# Patient Record
Sex: Female | Born: 1995 | Race: White | Hispanic: No | Marital: Married | Smoking: Never smoker
Health system: Southern US, Community
[De-identification: ages and names within clinical notes are randomized; demographics above are authoritative.]

## PROBLEM LIST (undated history)

## (undated) HISTORY — PX: WISDOM TOOTH EXTRACTION: SHX21

---

## 2017-01-24 ENCOUNTER — Ambulatory Visit: Payer: Self-pay | Admitting: Obstetrics & Gynecology

## 2017-01-31 DIAGNOSIS — R0602 Shortness of breath: Secondary | ICD-10-CM | POA: Diagnosis not present

## 2017-01-31 DIAGNOSIS — R002 Palpitations: Secondary | ICD-10-CM | POA: Diagnosis not present

## 2017-02-21 DIAGNOSIS — E038 Other specified hypothyroidism: Secondary | ICD-10-CM | POA: Diagnosis not present

## 2017-02-21 DIAGNOSIS — E782 Mixed hyperlipidemia: Secondary | ICD-10-CM | POA: Diagnosis not present

## 2017-02-21 DIAGNOSIS — E559 Vitamin D deficiency, unspecified: Secondary | ICD-10-CM | POA: Diagnosis not present

## 2017-02-21 DIAGNOSIS — E119 Type 2 diabetes mellitus without complications: Secondary | ICD-10-CM | POA: Diagnosis not present

## 2017-02-21 DIAGNOSIS — N925 Other specified irregular menstruation: Secondary | ICD-10-CM | POA: Diagnosis not present

## 2017-02-21 DIAGNOSIS — I10 Essential (primary) hypertension: Secondary | ICD-10-CM | POA: Diagnosis not present

## 2017-02-21 DIAGNOSIS — D518 Other vitamin B12 deficiency anemias: Secondary | ICD-10-CM | POA: Diagnosis not present

## 2017-02-22 DIAGNOSIS — R0600 Dyspnea, unspecified: Secondary | ICD-10-CM | POA: Diagnosis not present

## 2017-02-22 DIAGNOSIS — R002 Palpitations: Secondary | ICD-10-CM | POA: Diagnosis not present

## 2017-03-07 DIAGNOSIS — R0602 Shortness of breath: Secondary | ICD-10-CM | POA: Diagnosis not present

## 2017-03-07 DIAGNOSIS — R002 Palpitations: Secondary | ICD-10-CM | POA: Diagnosis not present

## 2017-03-15 DIAGNOSIS — N926 Irregular menstruation, unspecified: Secondary | ICD-10-CM | POA: Diagnosis not present

## 2017-03-15 DIAGNOSIS — M419 Scoliosis, unspecified: Secondary | ICD-10-CM | POA: Diagnosis not present

## 2017-03-15 DIAGNOSIS — N925 Other specified irregular menstruation: Secondary | ICD-10-CM | POA: Diagnosis not present

## 2017-03-15 DIAGNOSIS — M4185 Other forms of scoliosis, thoracolumbar region: Secondary | ICD-10-CM | POA: Diagnosis not present

## 2017-03-15 DIAGNOSIS — R0781 Pleurodynia: Secondary | ICD-10-CM | POA: Diagnosis not present

## 2017-03-15 DIAGNOSIS — D270 Benign neoplasm of right ovary: Secondary | ICD-10-CM | POA: Diagnosis not present

## 2017-03-15 DIAGNOSIS — M549 Dorsalgia, unspecified: Secondary | ICD-10-CM | POA: Diagnosis not present

## 2017-03-21 DIAGNOSIS — Z6820 Body mass index (BMI) 20.0-20.9, adult: Secondary | ICD-10-CM | POA: Diagnosis not present

## 2017-03-21 DIAGNOSIS — I471 Supraventricular tachycardia: Secondary | ICD-10-CM | POA: Diagnosis not present

## 2017-03-21 DIAGNOSIS — R002 Palpitations: Secondary | ICD-10-CM | POA: Diagnosis not present

## 2017-03-22 DIAGNOSIS — N898 Other specified noninflammatory disorders of vagina: Secondary | ICD-10-CM | POA: Diagnosis not present

## 2017-03-22 DIAGNOSIS — N925 Other specified irregular menstruation: Secondary | ICD-10-CM | POA: Diagnosis not present

## 2017-03-22 DIAGNOSIS — M4184 Other forms of scoliosis, thoracic region: Secondary | ICD-10-CM | POA: Diagnosis not present

## 2017-04-11 DIAGNOSIS — K219 Gastro-esophageal reflux disease without esophagitis: Secondary | ICD-10-CM | POA: Diagnosis not present

## 2017-04-11 DIAGNOSIS — R269 Unspecified abnormalities of gait and mobility: Secondary | ICD-10-CM | POA: Diagnosis not present

## 2017-04-11 DIAGNOSIS — R1013 Epigastric pain: Secondary | ICD-10-CM | POA: Diagnosis not present

## 2017-05-13 DIAGNOSIS — Z6821 Body mass index (BMI) 21.0-21.9, adult: Secondary | ICD-10-CM | POA: Diagnosis not present

## 2017-05-13 DIAGNOSIS — R319 Hematuria, unspecified: Secondary | ICD-10-CM | POA: Diagnosis not present

## 2017-05-13 DIAGNOSIS — R300 Dysuria: Secondary | ICD-10-CM | POA: Diagnosis not present

## 2017-06-25 DIAGNOSIS — R1013 Epigastric pain: Secondary | ICD-10-CM | POA: Diagnosis not present

## 2017-07-01 DIAGNOSIS — K3189 Other diseases of stomach and duodenum: Secondary | ICD-10-CM | POA: Diagnosis not present

## 2017-07-01 DIAGNOSIS — K2970 Gastritis, unspecified, without bleeding: Secondary | ICD-10-CM | POA: Diagnosis not present

## 2017-07-01 DIAGNOSIS — K219 Gastro-esophageal reflux disease without esophagitis: Secondary | ICD-10-CM | POA: Diagnosis not present

## 2017-07-01 DIAGNOSIS — K2900 Acute gastritis without bleeding: Secondary | ICD-10-CM | POA: Diagnosis not present

## 2017-07-01 DIAGNOSIS — K2950 Unspecified chronic gastritis without bleeding: Secondary | ICD-10-CM | POA: Diagnosis not present

## 2017-07-01 DIAGNOSIS — D72820 Lymphocytosis (symptomatic): Secondary | ICD-10-CM | POA: Diagnosis not present

## 2017-07-01 DIAGNOSIS — R1013 Epigastric pain: Secondary | ICD-10-CM | POA: Diagnosis not present

## 2017-07-22 DIAGNOSIS — R109 Unspecified abdominal pain: Secondary | ICD-10-CM | POA: Diagnosis not present

## 2017-07-22 DIAGNOSIS — D270 Benign neoplasm of right ovary: Secondary | ICD-10-CM | POA: Diagnosis not present

## 2017-07-22 DIAGNOSIS — N925 Other specified irregular menstruation: Secondary | ICD-10-CM | POA: Diagnosis not present

## 2017-08-01 DIAGNOSIS — N925 Other specified irregular menstruation: Secondary | ICD-10-CM | POA: Diagnosis not present

## 2017-08-01 DIAGNOSIS — M4184 Other forms of scoliosis, thoracic region: Secondary | ICD-10-CM | POA: Diagnosis not present

## 2017-08-01 DIAGNOSIS — R590 Localized enlarged lymph nodes: Secondary | ICD-10-CM | POA: Diagnosis not present

## 2017-08-10 DIAGNOSIS — N3000 Acute cystitis without hematuria: Secondary | ICD-10-CM | POA: Diagnosis not present

## 2017-08-10 DIAGNOSIS — N3090 Cystitis, unspecified without hematuria: Secondary | ICD-10-CM | POA: Diagnosis not present

## 2017-09-17 DIAGNOSIS — Z01419 Encounter for gynecological examination (general) (routine) without abnormal findings: Secondary | ICD-10-CM | POA: Diagnosis not present

## 2017-09-17 DIAGNOSIS — Z113 Encounter for screening for infections with a predominantly sexual mode of transmission: Secondary | ICD-10-CM | POA: Diagnosis not present

## 2017-09-17 DIAGNOSIS — N898 Other specified noninflammatory disorders of vagina: Secondary | ICD-10-CM | POA: Diagnosis not present

## 2017-09-17 DIAGNOSIS — Z8742 Personal history of other diseases of the female genital tract: Secondary | ICD-10-CM | POA: Diagnosis not present

## 2017-09-17 DIAGNOSIS — Z118 Encounter for screening for other infectious and parasitic diseases: Secondary | ICD-10-CM | POA: Diagnosis not present

## 2017-09-17 DIAGNOSIS — Z23 Encounter for immunization: Secondary | ICD-10-CM | POA: Diagnosis not present

## 2017-09-17 DIAGNOSIS — N390 Urinary tract infection, site not specified: Secondary | ICD-10-CM | POA: Diagnosis not present

## 2017-09-17 DIAGNOSIS — Z124 Encounter for screening for malignant neoplasm of cervix: Secondary | ICD-10-CM | POA: Diagnosis not present

## 2017-09-17 DIAGNOSIS — N9412 Deep dyspareunia: Secondary | ICD-10-CM | POA: Diagnosis not present

## 2017-09-18 DIAGNOSIS — N390 Urinary tract infection, site not specified: Secondary | ICD-10-CM | POA: Diagnosis not present

## 2017-09-18 DIAGNOSIS — R82998 Other abnormal findings in urine: Secondary | ICD-10-CM | POA: Diagnosis not present

## 2017-10-03 DIAGNOSIS — N390 Urinary tract infection, site not specified: Secondary | ICD-10-CM | POA: Diagnosis not present

## 2017-10-16 DIAGNOSIS — D270 Benign neoplasm of right ovary: Secondary | ICD-10-CM | POA: Diagnosis not present

## 2017-10-16 DIAGNOSIS — N9412 Deep dyspareunia: Secondary | ICD-10-CM | POA: Diagnosis not present

## 2017-12-05 DIAGNOSIS — R1084 Generalized abdominal pain: Secondary | ICD-10-CM | POA: Diagnosis not present

## 2018-04-10 DIAGNOSIS — R10814 Left lower quadrant abdominal tenderness: Secondary | ICD-10-CM | POA: Diagnosis not present

## 2018-04-10 DIAGNOSIS — K4190 Unilateral femoral hernia, without obstruction or gangrene, not specified as recurrent: Secondary | ICD-10-CM | POA: Diagnosis not present

## 2018-04-11 DIAGNOSIS — Z13220 Encounter for screening for lipoid disorders: Secondary | ICD-10-CM | POA: Diagnosis not present

## 2018-04-11 DIAGNOSIS — R10814 Left lower quadrant abdominal tenderness: Secondary | ICD-10-CM | POA: Diagnosis not present

## 2018-04-17 DIAGNOSIS — R109 Unspecified abdominal pain: Secondary | ICD-10-CM | POA: Diagnosis not present

## 2018-04-17 DIAGNOSIS — R1900 Intra-abdominal and pelvic swelling, mass and lump, unspecified site: Secondary | ICD-10-CM | POA: Diagnosis not present

## 2018-04-17 DIAGNOSIS — R1904 Left lower quadrant abdominal swelling, mass and lump: Secondary | ICD-10-CM | POA: Diagnosis not present

## 2018-06-21 DIAGNOSIS — N3001 Acute cystitis with hematuria: Secondary | ICD-10-CM | POA: Diagnosis not present

## 2018-06-21 DIAGNOSIS — R300 Dysuria: Secondary | ICD-10-CM | POA: Diagnosis not present

## 2018-06-21 DIAGNOSIS — R1084 Generalized abdominal pain: Secondary | ICD-10-CM | POA: Diagnosis not present

## 2019-05-18 DIAGNOSIS — N390 Urinary tract infection, site not specified: Secondary | ICD-10-CM | POA: Diagnosis not present

## 2019-12-13 DIAGNOSIS — Z20818 Contact with and (suspected) exposure to other bacterial communicable diseases: Secondary | ICD-10-CM | POA: Diagnosis not present

## 2019-12-13 DIAGNOSIS — Z20822 Contact with and (suspected) exposure to covid-19: Secondary | ICD-10-CM | POA: Diagnosis not present

## 2019-12-23 DIAGNOSIS — U071 COVID-19: Secondary | ICD-10-CM | POA: Diagnosis not present

## 2020-02-16 ENCOUNTER — Emergency Department (HOSPITAL_COMMUNITY)

## 2020-02-16 ENCOUNTER — Other Ambulatory Visit: Payer: Self-pay

## 2020-02-16 ENCOUNTER — Inpatient Hospital Stay (HOSPITAL_COMMUNITY)
Admission: EM | Admit: 2020-02-16 | Discharge: 2020-02-17 | DRG: 909 | Disposition: A | Discharge status: Home / Self Care | Attending: Student | Admitting: Student

## 2020-02-16 ENCOUNTER — Encounter (HOSPITAL_COMMUNITY): Payer: Self-pay

## 2020-02-16 DIAGNOSIS — Y9263 Factory as the place of occurrence of the external cause: Secondary | ICD-10-CM

## 2020-02-16 DIAGNOSIS — Z20822 Contact with and (suspected) exposure to covid-19: Secondary | ICD-10-CM | POA: Diagnosis present

## 2020-02-16 DIAGNOSIS — S81831A Puncture wound without foreign body, right lower leg, initial encounter: Secondary | ICD-10-CM | POA: Diagnosis present

## 2020-02-16 DIAGNOSIS — W230XXA Caught, crushed, jammed, or pinched between moving objects, initial encounter: Secondary | ICD-10-CM | POA: Diagnosis present

## 2020-02-16 DIAGNOSIS — S8781XA Crushing injury of right lower leg, initial encounter: Principal | ICD-10-CM | POA: Diagnosis present

## 2020-02-16 DIAGNOSIS — Y990 Civilian activity done for income or pay: Secondary | ICD-10-CM | POA: Diagnosis not present

## 2020-02-16 LAB — CBC WITH DIFFERENTIAL/PLATELET
Abs Immature Granulocytes: 0.08 10*3/uL — ABNORMAL HIGH (ref 0.00–0.07)
Basophils Absolute: 0.1 10*3/uL (ref 0.0–0.1)
Basophils Relative: 0 %
Eosinophils Absolute: 0.1 10*3/uL (ref 0.0–0.5)
Eosinophils Relative: 0 %
HCT: 40.5 % (ref 36.0–46.0)
Hemoglobin: 13.6 g/dL (ref 12.0–15.0)
Immature Granulocytes: 1 %
Lymphocytes Relative: 14 %
Lymphs Abs: 2.4 10*3/uL (ref 0.7–4.0)
MCH: 30.7 pg (ref 26.0–34.0)
MCHC: 33.6 g/dL (ref 30.0–36.0)
MCV: 91.4 fL (ref 80.0–100.0)
Monocytes Absolute: 1.2 10*3/uL — ABNORMAL HIGH (ref 0.1–1.0)
Monocytes Relative: 7 %
Neutro Abs: 13.3 10*3/uL — ABNORMAL HIGH (ref 1.7–7.7)
Neutrophils Relative %: 78 %
Platelets: 278 10*3/uL (ref 150–400)
RBC: 4.43 MIL/uL (ref 3.87–5.11)
RDW: 12.5 % (ref 11.5–15.5)
WBC: 17.1 10*3/uL — ABNORMAL HIGH (ref 4.0–10.5)
nRBC: 0 % (ref 0.0–0.2)

## 2020-02-16 LAB — VITAMIN D 25 HYDROXY (VIT D DEFICIENCY, FRACTURES): Vit D, 25-Hydroxy: 29.12 ng/mL — ABNORMAL LOW (ref 30–100)

## 2020-02-16 LAB — POC URINE PREG, ED: Preg Test, Ur: NEGATIVE

## 2020-02-16 LAB — BASIC METABOLIC PANEL WITH GFR
Anion gap: 9 (ref 5–15)
BUN: 12 mg/dL (ref 6–20)
CO2: 23 mmol/L (ref 22–32)
Calcium: 9 mg/dL (ref 8.9–10.3)
Chloride: 105 mmol/L (ref 98–111)
Creatinine, Ser: 0.98 mg/dL (ref 0.44–1.00)
GFR calc Af Amer: >60 mL/min (ref >60)
GFR calc non Af Amer: >60 mL/min (ref >60)
Glucose, Bld: 141 mg/dL — ABNORMAL HIGH (ref 70–99)
Potassium: 3.4 mmol/L — ABNORMAL LOW (ref 3.5–5.1)
Sodium: 137 mmol/L (ref 135–145)

## 2020-02-16 LAB — RESPIRATORY PANEL BY RT PCR (FLU A&B, COVID)
Influenza A by PCR: NEGATIVE
Influenza B by PCR: NEGATIVE
SARS Coronavirus 2 by RT PCR: NEGATIVE

## 2020-02-16 LAB — CK: Total CK: 2228 U/L — ABNORMAL HIGH (ref 38–234)

## 2020-02-16 LAB — HIV ANTIBODY (ROUTINE TESTING W REFLEX): HIV Screen 4th Generation wRfx: NONREACTIVE

## 2020-02-16 MED ORDER — PREGABALIN 75 MG PO CAPS
75.0000 mg | ORAL_CAPSULE | Freq: Two times a day (BID) | ORAL | Status: DC
  Administered 2020-02-16: 75 mg via ORAL
  Filled 2020-02-16: qty 1

## 2020-02-16 MED ORDER — DOCUSATE SODIUM 100 MG PO CAPS
100.0000 mg | ORAL_CAPSULE | Freq: Two times a day (BID) | ORAL | Status: DC
  Administered 2020-02-16: 100 mg via ORAL
  Filled 2020-02-16: qty 1

## 2020-02-16 MED ORDER — ACETAMINOPHEN 500 MG PO TABS
500.0000 mg | ORAL_TABLET | Freq: Three times a day (TID) | ORAL | Status: DC
  Administered 2020-02-17: 500 mg via ORAL
  Filled 2020-02-16 (×2): qty 1

## 2020-02-16 MED ORDER — MORPHINE SULFATE (PF) 2 MG/ML IV SOLN
0.5000 mg | INTRAVENOUS | Status: DC | PRN
  Administered 2020-02-17: 1 mg via INTRAVENOUS
  Filled 2020-02-16: qty 1

## 2020-02-16 MED ORDER — HYDROMORPHONE HCL 1 MG/ML IJ SOLN
1.0000 mg | Freq: Once | INTRAMUSCULAR | Status: AC
  Administered 2020-02-16: 1 mg via INTRAVENOUS
  Filled 2020-02-16: qty 1

## 2020-02-16 MED ORDER — ONDANSETRON HCL 4 MG/2ML IJ SOLN
4.0000 mg | Freq: Four times a day (QID) | INTRAMUSCULAR | Status: DC | PRN
  Administered 2020-02-16: 4 mg via INTRAVENOUS
  Filled 2020-02-16: qty 2

## 2020-02-16 MED ORDER — LACTATED RINGERS IV SOLN: INTRAVENOUS | Status: DC

## 2020-02-16 MED ORDER — METHOCARBAMOL 500 MG PO TABS
750.0000 mg | ORAL_TABLET | Freq: Three times a day (TID) | ORAL | Status: DC
  Administered 2020-02-16 – 2020-02-17 (×2): 750 mg via ORAL
  Filled 2020-02-16 (×2): qty 2

## 2020-02-16 MED ORDER — HYDROCODONE-ACETAMINOPHEN 7.5-325 MG PO TABS
1.0000 | ORAL_TABLET | ORAL | Status: DC | PRN
  Administered 2020-02-17: 2 via ORAL
  Filled 2020-02-16: qty 2

## 2020-02-16 MED ORDER — SODIUM CHLORIDE 0.9 % IV SOLN
2.0000 g | INTRAVENOUS | Status: DC
  Administered 2020-02-16: 2 g via INTRAVENOUS
  Filled 2020-02-16: qty 20

## 2020-02-16 MED ORDER — MAGNESIUM HYDROXIDE 400 MG/5ML PO SUSP
30.0000 mL | Freq: Every day | ORAL | Status: DC | PRN
  Filled 2020-02-16: qty 30

## 2020-02-16 MED ORDER — ASCORBIC ACID 500 MG PO TABS
1000.0000 mg | ORAL_TABLET | Freq: Every day | ORAL | Status: DC
  Administered 2020-02-16: 1000 mg via ORAL
  Filled 2020-02-16: qty 2

## 2020-02-16 MED ORDER — ACETAMINOPHEN 325 MG PO TABS: 325.0000 mg | ORAL_TABLET | Freq: Four times a day (QID) | ORAL | Status: DC | PRN

## 2020-02-16 MED ORDER — SODIUM CHLORIDE 0.9 % IV SOLN: INTRAVENOUS | Status: DC

## 2020-02-16 MED ORDER — CEFAZOLIN SODIUM-DEXTROSE 2-4 GM/100ML-% IV SOLN
2.0000 g | Freq: Once | INTRAVENOUS | Status: AC
  Administered 2020-02-16: 2 g via INTRAVENOUS
  Filled 2020-02-16: qty 100

## 2020-02-16 MED ORDER — METHOCARBAMOL 1000 MG/10ML IJ SOLN
500.0000 mg | Freq: Three times a day (TID) | INTRAVENOUS | Status: DC
  Filled 2020-02-16 (×4): qty 5

## 2020-02-16 MED ORDER — TETANUS-DIPHTH-ACELL PERTUSSIS 5-2.5-18.5 LF-MCG/0.5 IM SUSP
0.5000 mL | Freq: Once | INTRAMUSCULAR | Status: AC
  Administered 2020-02-16: 0.5 mL via INTRAMUSCULAR
  Filled 2020-02-16: qty 0.5

## 2020-02-16 MED ORDER — HYDROCODONE-ACETAMINOPHEN 5-325 MG PO TABS
1.0000 | ORAL_TABLET | ORAL | Status: DC | PRN
  Administered 2020-02-16 – 2020-02-17 (×2): 2 via ORAL
  Filled 2020-02-16 (×2): qty 2

## 2020-02-16 NOTE — ED Notes (Signed)
Labs drawn, labeled with 2 pt identifiers, and sent to lab Covid swab collected, labeled with 2 pt identifiers, and sent to lab

## 2020-02-16 NOTE — ED Notes (Signed)
Report given to Ashley, RN. All questions answered

## 2020-02-16 NOTE — ED Triage Notes (Signed)
Pt from work at [employer] via ems; while at work pt standing on machine, machine crushed leg betweenm 2 parts of machine  that came together, crushed leg; deformity to R leg; dime sized  wound medial R upper calf; oozing blood; pedal pulses present, cap refill less than 2 seconds, sinus tach 120s, 75 mcg fentanyl given PTA, pain 8/10-5/10 after fentanyl; CBG 106; 136/78, 100% RA

## 2020-02-16 NOTE — ED Notes (Signed)
Meds given per MAR. Name/DOB verified with pt. Pt transported to 5N02 in NAD with all belongings via cart transport. Pt alert, speaking in full sentences. Breathing easy, non-labored. Equal rise and fall of chest noted

## 2020-02-16 NOTE — H&P (Signed)
[facility] H&P    Renee Taylor is an 24 y.o. female.  HPI: Patient works in [employer], educational and sustained a crushing injury to her right calf today at 3:30 pm. Last ate at 2:00 pm. Pain is moderately severe, dull, and aching without tingling or numbness currently. Was slightly worse before. Elevated with saline dressing at heart level but no ice. Able to tolerate flexion and extension of great and lesser toes without difficulty. Two wounds with significant loss of muscle contour on the medial side underlying 2 cm laceration and a small poke hole posteriorly which is less than 5 mm but again ecchymosis and significant soft tissue injury. Significant other at bedside.  History reviewed. No pertinent past medical history.        Past Surgical History:  Procedure Laterality Date  . WISDOM TOOTH EXTRACTION     No family history on file.  Social History: reports that she has never smoked. She has never used smokeless tobacco. She reports current alcohol use. She reports that she does not use drugs.  Allergies:      Allergies  Allergen Reactions  . Meloxicam Shortness Of Breath  . Sulfa Antibiotics Nausea And Vomiting  . Macrobid [Nitrofurantoin] Rash   Medications: Prior to Admission: (Not in a hospital admission)   Lab Results Last 48 Hours                                                                                                                                                                                                                                                             Imaging Results (Last 48 hours)    ROS No recent fever, bleeding abnormalities, urologic dysfunction, GI problems, or weight gain.   Blood pressure 111/79, pulse (!) 122, temperature 98.1 F (36.7 C), temperature source Oral, resp. rate (!) 25, height 5\' 2"  (1.575 m), weight 58.1 kg, last menstrual period 02/04/2020, SpO2 100 %.   Physical Exam   Mild distress, but tears have dried.  Reg rhythm, Tachycardic 120's, 130's on first eval  No audible wheezing  LLE Tender, open wounds 2 cm laceration anteromedial and a small poke hole posteriorly which is less than 5 mm with significant loss of muscle contour on the medial side underlying wound; scattered ecchymosis  No knee or  ankle effusion  Knee stability could not be assessed well given pain in calf  Sens DPN, SPN, TN intact  Motor EHL, ext, flex, evers intact grossly  DP 2+, PT 2+, No significant distal edema   Assessment/Plan:  Right leg crush injury with obviously muscle and soft tissue injury, potential for rhabdomyolysis  1. Admit for observation, CK checks, and aggressive hydration  2. Plan for surgical debridement tonight or tomorrow am depending on Covid test which unfortunately has just been ordered  3. IV abx with Rocephin given contaminated industrial equipment  4. Fresh saline dressing has been applied.  5. Aggressive ice  6. Discharge post op could occur depending on intraop findings, labs, and PT   Myrene Galas, MD  [facility], [facility]  [phone]  02/16/2020 8:31 PM

## 2020-02-16 NOTE — ED Provider Notes (Signed)
The patient is a 24 year old female presenting with a crush injury to her right lower extremity, she states that her leg got caught between 2 pieces of machinery that crushed, she did suffered a puncture wound to the anterior medial aspect of the proximal lower extremity just distal to the knee, she has an obvious deformity in that area, she has good pulses at the foot.  She has no other signs of injury but is mildly tachycardic.  She does have an open wound, tetanus will be updated, will treat for possible open fracture with 2 g of Ancef  Pain control  Orthopedic consultation as imaging suggest.  No signs of compartment syndrome at this time.  Medical screening examination/treatment/procedure(s) were conducted as a shared visit with non-physician practitioner(s) and myself.  I personally evaluated the patient during the encounter.  Clinical Impression:   Final diagnoses:  Crushing injury of right lower leg, initial encounter         Eber Hong, MD 02/18/20 2250

## 2020-02-16 NOTE — ED Notes (Signed)
Pt transported to xray 

## 2020-02-16 NOTE — ED Notes (Incomplete)
Meds given per MAR. Name/DOB verified with pt 

## 2020-02-16 NOTE — Consult Note (Addendum)
Orthopaedic Trauma Service Consultation  Reason for Consult: right calf crush injury Requesting Physician: Eber Hong, MD  Renee Taylor is an 24 y.o. female.  HPI: Patient works in Set [employer] and sustained a crushing injury to her right calf today at 3:30 pm. Last ate at 2:00 pm. Pain is moderately severe, dull, and aching without tingling or numbness currently. Was slightly worse before. Elevated with saline dressing at heart level but no ice. Able to tolerate flexion and extension of great and lesser toes without difficulty. Two wounds with significant loss of muscle contour on the medial side underlying 2 cm laceration and a small poke hole posteriorly which is less than 5 mm but again ecchymosis and significant soft tissue injury. Significant other at bedside.   History reviewed. No pertinent past medical history.  Past Surgical History:  Procedure Laterality Date  . WISDOM TOOTH EXTRACTION      No family history on file.  Social History:  reports that she has never smoked. She has never used smokeless tobacco. She reports current alcohol use. She reports that she does not use drugs.  Allergies:  Allergies  Allergen Reactions  . Meloxicam Shortness Of Breath  . Sulfa Antibiotics Nausea And Vomiting  . Macrobid [Nitrofurantoin] Rash    Medications: Prior to Admission: (Not in a hospital admission)   Results for orders placed or performed during the hospital encounter of 02/16/20 (from the past 48 hour(s))  CBC with Differential/Platelet     Status: Abnormal   Collection Time: 02/16/20  5:00 PM  Result Value Ref Range   WBC 17.1 (H) 4.0 - 10.5 K/uL   RBC 4.43 3.87 - 5.11 MIL/uL   Hemoglobin 13.6 12.0 - 15.0 g/dL   HCT 44.0 10.2 - 72.5 %   MCV 91.4 80.0 - 100.0 fL   MCH 30.7 26.0 - 34.0 pg   MCHC 33.6 30.0 - 36.0 g/dL   RDW 36.6 44.0 - 34.7 %   Platelets 278 150 - 400 K/uL   nRBC 0.0 0.0 - 0.2 %   Neutrophils Relative % 78 %   Neutro Abs 13.3 (H) 1.7 - 7.7 K/uL    Lymphocytes Relative 14 %   Lymphs Abs 2.4 0.7 - 4.0 K/uL   Monocytes Relative 7 %   Monocytes Absolute 1.2 (H) 0.1 - 1.0 K/uL   Eosinophils Relative 0 %   Eosinophils Absolute 0.1 0.0 - 0.5 K/uL   Basophils Relative 0 %   Basophils Absolute 0.1 0.0 - 0.1 K/uL   Immature Granulocytes 1 %   Abs Immature Granulocytes 0.08 (H) 0.00 - 0.07 K/uL    Comment: Performed at [facility], [address], [place], [place] [address]  Basic metabolic panel     Status: Abnormal   Collection Time: 02/16/20  5:00 PM  Result Value Ref Range   Sodium 137 135 - 145 mmol/L   Potassium 3.4 (L) 3.5 - 5.1 mmol/L   Chloride 105 98 - 111 mmol/L   CO2 23 22 - 32 mmol/L   Glucose, Bld 141 (H) 70 - 99 mg/dL    Comment: Glucose reference range applies only to samples taken after fasting for at least 8 hours.   BUN 12 6 - 20 mg/dL   Creatinine, Ser 6.38 0.44 - 1.00 mg/dL   Calcium 9.0 8.9 - 75.6 mg/dL   GFR calc non Af Amer >60 >60 mL/min   GFR calc Af Amer >60 >60 mL/min   Anion gap 9 5 - 15  Comment: Performed at [facility], [place] [address] [place], [place] [address]    DG Tibia/Fibula Right  Result Date: 02/16/2020 CLINICAL DATA:  Trauma, compression injury EXAM: RIGHT TIBIA AND FIBULA - 2 VIEW COMPARISON:  None. FINDINGS: No fracture of the tibia or fibula. There is soft tissue injury in the medial LEFT calf some subcutaneous gas and soft tissue depression.a. IMPRESSION: 1. Soft tissue injury. 2. No evidence of fracture. Electronically Signed   By: Suzy Bouchard M.D.   On: 02/16/2020 18:04   DG Knee Complete 4 Views Right  Result Date: 02/16/2020 CLINICAL DATA:  Status post trauma. EXAM: RIGHT KNEE - COMPLETE 4+ VIEW COMPARISON:  None. FINDINGS: No evidence of fracture, dislocation, or joint effusion. No evidence of arthropathy or other focal bone abnormality. A mild amount of soft tissue air is seen along the medial and lateral aspects of the right calf. IMPRESSION: Mild amount of soft  tissue air within the medial and lateral aspects of the right calf, without evidence of associated acute osseous abnormality. Electronically Signed   By: Virgina Norfolk M.D.   On: 02/16/2020 18:03    ROS No recent fever, bleeding abnormalities, urologic dysfunction, GI problems, or weight gain.  Blood pressure 111/79, pulse (!) 122, temperature 98.1 F (36.7 C), temperature source Oral, resp. rate (!) 25, height 5\' 2"  (1.575 m), weight 58.1 kg, last menstrual period 02/04/2020, SpO2 100 %. Physical Exam Mild distress, but tears have dried. Reg rhythm, Tachycardic 120's, 130's on first eval No audible wheezing LLE Tender, open wounds 2 cm laceration anteromedial and a small poke hole posteriorly which is less than 5 mm with significant loss of muscle contour on the medial side underlying wound; scattered ecchymosis  No knee or ankle effusion  Knee stability could not be assessed well given pain in calf  Sens DPN, SPN, TN intact  Motor EHL, ext, flex, evers intact grossly  DP 2+, PT 2+, No significant distal edema  Assessment/Plan: Right leg crush injury with obviously muscle and soft tissue injury, potential for rhabdomyolysis   1. Admit for observation, CK checks, and aggressive hydration 2. Plan for surgical debridement tonight or tomorrow am depending on Covid test which unfortunately has just been ordered 3. IV abx with Rocephin given contaminated industrial equipment 4. Fresh saline dressing has been applied. 5. Aggressive ice  6. Discharge post op could occur depending on intraop findings, labs, and PT  [place], MD [facility], [facility] [phone]  02/16/2020  8:31 PM

## 2020-02-16 NOTE — ED Notes (Signed)
Ice bags placed on pt Right leg.

## 2020-02-16 NOTE — ED Notes (Signed)
Pt requests no calls be transferred into pt room. Secretary notified and states she will make pt private.

## 2020-02-17 ENCOUNTER — Inpatient Hospital Stay (HOSPITAL_COMMUNITY)

## 2020-02-17 ENCOUNTER — Encounter (HOSPITAL_COMMUNITY)
Admission: EM | Disposition: A | Discharge status: Home / Self Care | Payer: Self-pay | Source: Home / Self Care | Attending: Orthopedic Surgery

## 2020-02-17 HISTORY — PX: I & D EXTREMITY: SHX5045

## 2020-02-17 LAB — COMPREHENSIVE METABOLIC PANEL WITH GFR
ALT: 23 U/L (ref 0–44)
AST: 68 U/L — ABNORMAL HIGH (ref 15–41)
Albumin: 3.7 g/dL (ref 3.5–5.0)
Alkaline Phosphatase: 48 U/L (ref 38–126)
Anion gap: 12 (ref 5–15)
BUN: 8 mg/dL (ref 6–20)
CO2: 24 mmol/L (ref 22–32)
Calcium: 9 mg/dL (ref 8.9–10.3)
Chloride: 105 mmol/L (ref 98–111)
Creatinine, Ser: 0.93 mg/dL (ref 0.44–1.00)
GFR calc Af Amer: >60 mL/min (ref >60)
GFR calc non Af Amer: >60 mL/min (ref >60)
Glucose, Bld: 112 mg/dL — ABNORMAL HIGH (ref 70–99)
Potassium: 3.9 mmol/L (ref 3.5–5.1)
Sodium: 141 mmol/L (ref 135–145)
Total Bilirubin: 0.7 mg/dL (ref 0.3–1.2)
Total Protein: 6.5 g/dL (ref 6.5–8.1)

## 2020-02-17 LAB — CBC
HCT: 38.6 % (ref 36.0–46.0)
Hemoglobin: 12.8 g/dL (ref 12.0–15.0)
MCH: 30.5 pg (ref 26.0–34.0)
MCHC: 33.2 g/dL (ref 30.0–36.0)
MCV: 92.1 fL (ref 80.0–100.0)
Platelets: 267 10*3/uL (ref 150–400)
RBC: 4.19 MIL/uL (ref 3.87–5.11)
RDW: 12.6 % (ref 11.5–15.5)
WBC: 10.4 10*3/uL (ref 4.0–10.5)
nRBC: 0 % (ref 0.0–0.2)

## 2020-02-17 LAB — SURGICAL PCR SCREEN
MRSA, PCR: POSITIVE — AB
Staphylococcus aureus: POSITIVE — AB

## 2020-02-17 LAB — CK: Total CK: 3186 U/L — ABNORMAL HIGH (ref 38–234)

## 2020-02-17 SURGERY — IRRIGATION AND DEBRIDEMENT EXTREMITY
Anesthesia: General | Site: Leg Lower | Laterality: Right

## 2020-02-17 MED ORDER — PROMETHAZINE HCL 25 MG/ML IJ SOLN: 6.2500 mg | INTRAMUSCULAR | Status: DC | PRN

## 2020-02-17 MED ORDER — FENTANYL CITRATE (PF) 250 MCG/5ML IJ SOLN
INTRAMUSCULAR | Status: AC
  Filled 2020-02-17: qty 5

## 2020-02-17 MED ORDER — DEXAMETHASONE SODIUM PHOSPHATE 10 MG/ML IJ SOLN
INTRAMUSCULAR | Status: AC
  Filled 2020-02-17: qty 1

## 2020-02-17 MED ORDER — VANCOMYCIN HCL 1000 MG IV SOLR
INTRAVENOUS | Status: AC
  Filled 2020-02-17: qty 1000

## 2020-02-17 MED ORDER — FENTANYL CITRATE (PF) 100 MCG/2ML IJ SOLN
INTRAMUSCULAR | Status: DC | PRN
  Administered 2020-02-17: 50 ug via INTRAVENOUS

## 2020-02-17 MED ORDER — OXYCODONE HCL 5 MG PO TABS: 5.0000 mg | ORAL_TABLET | Freq: Once | ORAL | Status: DC | PRN

## 2020-02-17 MED ORDER — ONDANSETRON HCL 4 MG/2ML IJ SOLN
INTRAMUSCULAR | Status: AC
  Filled 2020-02-17: qty 2

## 2020-02-17 MED ORDER — MIDAZOLAM HCL 2 MG/2ML IJ SOLN
INTRAMUSCULAR | Status: AC
  Filled 2020-02-17: qty 2

## 2020-02-17 MED ORDER — HYDROCODONE-ACETAMINOPHEN 5-325 MG PO TABS: 1.0000 | ORAL_TABLET | Freq: Four times a day (QID) | ORAL | 0 refills | Status: AC | PRN

## 2020-02-17 MED ORDER — ACETAMINOPHEN 10 MG/ML IV SOLN: 1000.0000 mg | Freq: Once | INTRAVENOUS | Status: DC | PRN

## 2020-02-17 MED ORDER — PROPOFOL 10 MG/ML IV BOLUS
INTRAVENOUS | Status: AC
  Filled 2020-02-17: qty 40

## 2020-02-17 MED ORDER — ONDANSETRON HCL 4 MG/2ML IJ SOLN
INTRAMUSCULAR | Status: DC | PRN
  Administered 2020-02-17: 4 mg via INTRAVENOUS

## 2020-02-17 MED ORDER — MIDAZOLAM HCL 5 MG/5ML IJ SOLN
INTRAMUSCULAR | Status: DC | PRN
  Administered 2020-02-17: 2 mg via INTRAVENOUS

## 2020-02-17 MED ORDER — 0.9 % SODIUM CHLORIDE (POUR BTL) OPTIME
TOPICAL | Status: DC | PRN
  Administered 2020-02-17: 1000 mL

## 2020-02-17 MED ORDER — OXYCODONE HCL 5 MG/5ML PO SOLN: 5.0000 mg | Freq: Once | ORAL | Status: DC | PRN

## 2020-02-17 MED ORDER — SODIUM CHLORIDE 0.9 % IR SOLN
Status: DC | PRN
  Administered 2020-02-17: 3000 mL

## 2020-02-17 MED ORDER — VANCOMYCIN HCL 1000 MG IV SOLR
INTRAVENOUS | Status: DC | PRN
  Administered 2020-02-17: 1000 mg

## 2020-02-17 MED ORDER — PROPOFOL 10 MG/ML IV BOLUS
INTRAVENOUS | Status: DC | PRN
  Administered 2020-02-17: 200 mg via INTRAVENOUS

## 2020-02-17 MED ORDER — LACTATED RINGERS IV SOLN: INTRAVENOUS | Status: DC | PRN

## 2020-02-17 MED ORDER — DEXAMETHASONE SODIUM PHOSPHATE 10 MG/ML IJ SOLN
INTRAMUSCULAR | Status: DC | PRN
  Administered 2020-02-17: 5 mg via INTRAVENOUS

## 2020-02-17 MED ORDER — ASPIRIN EC 325 MG PO TBEC: 325.0000 mg | DELAYED_RELEASE_TABLET | Freq: Every day | ORAL | 0 refills | Status: AC

## 2020-02-17 MED ORDER — CEFAZOLIN SODIUM-DEXTROSE 1-4 GM/50ML-% IV SOLN
INTRAVENOUS | Status: DC | PRN
  Administered 2020-02-17: 1 g via INTRAVENOUS

## 2020-02-17 MED ORDER — VANCOMYCIN HCL 750 MG/150ML IV SOLN
750.0000 mg | Freq: Two times a day (BID) | INTRAVENOUS | Status: DC
  Administered 2020-02-17: 750 mg via INTRAVENOUS
  Filled 2020-02-17 (×3): qty 150

## 2020-02-17 MED ORDER — HYDROMORPHONE HCL 1 MG/ML IJ SOLN: 0.2500 mg | INTRAMUSCULAR | Status: DC | PRN

## 2020-02-17 MED ORDER — LIDOCAINE 2% (20 MG/ML) 5 ML SYRINGE
INTRAMUSCULAR | Status: DC | PRN
  Administered 2020-02-17: 60 mg via INTRAVENOUS

## 2020-02-17 SURGICAL SUPPLY — 50 items
BNDG COHESIVE 4X5 TAN STRL (GAUZE/BANDAGES/DRESSINGS) ×2 IMPLANT
BNDG ELASTIC 4X5.8 VLCR STR LF (GAUZE/BANDAGES/DRESSINGS) ×1 IMPLANT
BNDG GAUZE ELAST 4 BULKY (GAUZE/BANDAGES/DRESSINGS) ×4 IMPLANT
BRUSH SCRUB EZ PLAIN DRY (MISCELLANEOUS) ×4 IMPLANT
CHLORAPREP W/TINT 26 (MISCELLANEOUS) ×2 IMPLANT
COVER MAYO STAND STRL (DRAPES) ×2 IMPLANT
COVER SURGICAL LIGHT HANDLE (MISCELLANEOUS) ×4 IMPLANT
COVER WAND RF STERILE (DRAPES) ×2 IMPLANT
DRAPE ORTHO SPLIT 77X108 STRL (DRAPES) ×1
DRAPE SURG 17X23 STRL (DRAPES) ×2 IMPLANT
DRAPE SURG ORHT 6 SPLT 77X108 (DRAPES) ×1 IMPLANT
DRAPE U-SHAPE 47X51 STRL (DRAPES) ×2 IMPLANT
DRSG ADAPTIC 3X8 NADH LF (GAUZE/BANDAGES/DRESSINGS) ×2 IMPLANT
ELECT REM PT RETURN 9FT ADLT (ELECTROSURGICAL)
ELECTRODE REM PT RTRN 9FT ADLT (ELECTROSURGICAL) IMPLANT
EVACUATOR 1/8 PVC DRAIN (DRAIN) IMPLANT
GAUZE SPONGE 4X4 12PLY STRL (GAUZE/BANDAGES/DRESSINGS) ×2 IMPLANT
GLOVE BIO SURGEON STRL SZ 6.5 (GLOVE) ×6 IMPLANT
GLOVE BIO SURGEON STRL SZ7.5 (GLOVE) ×8 IMPLANT
GLOVE BIOGEL PI IND STRL 6.5 (GLOVE) ×1 IMPLANT
GLOVE BIOGEL PI IND STRL 7.5 (GLOVE) ×1 IMPLANT
GLOVE BIOGEL PI INDICATOR 6.5 (GLOVE) ×1
GLOVE BIOGEL PI INDICATOR 7.5 (GLOVE) ×1
GOWN STRL REUS W/ TWL LRG LVL3 (GOWN DISPOSABLE) ×2 IMPLANT
GOWN STRL REUS W/TWL LRG LVL3 (GOWN DISPOSABLE) ×2
HANDPIECE INTERPULSE COAX TIP (DISPOSABLE)
KIT BASIN OR (CUSTOM PROCEDURE TRAY) ×2 IMPLANT
KIT DRSG PREVENA PLUS 7DAY 125 (MISCELLANEOUS) ×1 IMPLANT
KIT PREVENA INCISION MGT 13 (CANNISTER) ×1 IMPLANT
KIT TURNOVER KIT B (KITS) ×2 IMPLANT
MANIFOLD NEPTUNE II (INSTRUMENTS) ×2 IMPLANT
NS IRRIG 1000ML POUR BTL (IV SOLUTION) ×2 IMPLANT
PACK ORTHO EXTREMITY (CUSTOM PROCEDURE TRAY) ×2 IMPLANT
PAD ARMBOARD 7.5X6 YLW CONV (MISCELLANEOUS) ×4 IMPLANT
PAD CAST 4YDX4 CTTN HI CHSV (CAST SUPPLIES) IMPLANT
PADDING CAST COTTON 4X4 STRL (CAST SUPPLIES) ×1
PADDING CAST COTTON 6X4 STRL (CAST SUPPLIES) ×2 IMPLANT
SET HNDPC FAN SPRY TIP SCT (DISPOSABLE) IMPLANT
SPONGE LAP 18X18 RF (DISPOSABLE) ×2 IMPLANT
SUT ETHILON 2 0 FS 18 (SUTURE) ×4 IMPLANT
SUT ETHILON 3 0 PS 1 (SUTURE) ×5 IMPLANT
SUT MON AB 2-0 CT1 36 (SUTURE) ×2 IMPLANT
SUT PDS AB 0 CT 36 (SUTURE) IMPLANT
SWAB CULTURE ESWAB REG 1ML (MISCELLANEOUS) IMPLANT
TOWEL GREEN STERILE (TOWEL DISPOSABLE) ×4 IMPLANT
TOWEL GREEN STERILE FF (TOWEL DISPOSABLE) ×2 IMPLANT
TUBE CONNECTING 12X1/4 (SUCTIONS) ×2 IMPLANT
UNDERPAD 30X30 (UNDERPADS AND DIAPERS) ×2 IMPLANT
WATER STERILE IRR 1000ML POUR (IV SOLUTION) ×2 IMPLANT
YANKAUER SUCT BULB TIP NO VENT (SUCTIONS) ×2 IMPLANT

## 2020-02-17 NOTE — Anesthesia Preprocedure Evaluation (Signed)
Anesthesia Evaluation    Reviewed: Allergy & Precautions, Patient's Chart, lab work & pertinent test results  Airway Mallampati: II  TM Distance: >3 FB Neck ROM: Full    Dental no notable dental hx.    Pulmonary neg pulmonary ROS,    Pulmonary exam normal breath sounds clear to auscultation       Cardiovascular negative cardio ROS Normal cardiovascular exam Rhythm:Regular Rate:Normal     Neuro/Psych negative neurological ROS     GI/Hepatic negative GI ROS, Neg liver ROS,   Endo/Other  negative endocrine ROS  Renal/GU negative Renal ROS     Musculoskeletal negative musculoskeletal ROS (+)   Abdominal   Peds  Hematology negative hematology ROS (+)   Anesthesia Other Findings right calf crush injury  Reproductive/Obstetrics hcg negative                             Anesthesia Physical Anesthesia Plan  ASA: I  Anesthesia Plan: General   Post-op Pain Management:    Induction: Intravenous  PONV Risk Score and Plan: 3 and Ondansetron, Dexamethasone, Midazolam and Treatment may vary due to age or medical condition  Airway Management Planned: LMA  Additional Equipment:   Intra-op Plan:   Post-operative Plan: Extubation in OR  Informed Consent: I have reviewed the patients History and Physical, chart, labs and discussed the procedure including the risks, benefits and alternatives for the proposed anesthesia with the patient or authorized representative who has indicated his/her understanding and acceptance.     Dental advisory given  Plan Discussed with: CRNA  Anesthesia Plan Comments:         Anesthesia Quick Evaluation

## 2020-02-17 NOTE — Transfer of Care (Signed)
Immediate Anesthesia Transfer of Care Note  Patient: Renee Taylor  Procedure(s) Performed: IRRIGATION AND DEBRIDEMENT EXTREMITY Application of Wound Vac (Right Leg Lower)  Patient Location: PACU  Anesthesia Type:General  Level of Consciousness: drowsy  Airway & Oxygen Therapy: Patient Spontanous Breathing and Patient connected to face mask oxygen  Post-op Assessment: Report given to RN and Post -op Vital signs reviewed and stable  Post vital signs: Reviewed and stable  Last Vitals:  Vitals Value Taken Time  BP 102/61 02/17/20 0925  Temp    Pulse 69 02/17/20 0926  Resp 9 02/17/20 0926  SpO2 100 % 02/17/20 0926  Vitals shown include unvalidated device data.  Last Pain:  Vitals:   02/17/20 3403  TempSrc: Oral  PainSc:          Complications: No apparent anesthesia complications

## 2020-02-17 NOTE — Anesthesia Procedure Notes (Addendum)
Procedure Name: LMA Insertion Date/Time: 02/17/2020 8:24 AM Performed by: Macie Burows, CRNA Pre-anesthesia Checklist: Patient identified, Emergency Drugs available, Suction available and Patient being monitored Patient Re-evaluated:Patient Re-evaluated prior to induction Oxygen Delivery Method: Circle system utilized Preoxygenation: Pre-oxygenation with 100% oxygen Induction Type: IV induction Ventilation: Mask ventilation without difficulty LMA: LMA inserted LMA Size: 4.0 Number of attempts: 1 Placement Confirmation: positive ETCO2 and breath sounds checked- equal and bilateral Tube secured with: Tape Dental Injury: Teeth and Oropharynx as per pre-operative assessment

## 2020-02-17 NOTE — Op Note (Signed)
Orthopaedic Surgery Operative Note (CSN: [id number] ) Date of Surgery: 02/17/2020  Admit Date: 02/16/2020   Diagnoses: Pre-Op Diagnoses: Right lower leg puncture wound/crush injury   Post-Op Diagnosis: Same  Procedures: 1. CPT 27601-Decompression of deep posterior compartment of right leg 2. CPT 11043-Debridement of right lower leg <20 sq cm 3. CPT 97605-Incisional wound vac placement  Surgeons : Primary: Beza Steppe, Gillie Manners, MD  Assistant: Ulyses Southward, PA-C  Location: OR 7   Anesthesia:General  Antibiotics: Ancef 1g preop, 1 gm vancomycin powder   Tourniquet time:None  Estimated Blood Loss:25 mL  Complications:None   Specimens:None   Implants: * No implants in log *   Indications for Surgery: 24 year old female who sustained a crushing injury to her right calf yesterday afternoon.  She received antibiotics in the emergency room after she presented.  She was initially seen by my partner Dr. Carola Frost who recommended irrigation and debridement of the open wound.  I discussed the risks and benefits of surgical intervention with the patient.  This included but not limited to bleeding, infection, nerve and blood vessel injury, DVT, need for soft tissue coverage, even the possibility anesthetic complications.  She agreed proceed with surgery and consent was obtained.  Operative Findings: 1.  2 cm laceration over the medial aspect of the calf that extended into the deep posterior compartment.  There was a smaller posterior lateral wound that was approximately 5 mm in length. 2.  Extension of the medial traumatic laceration to access the posterior compartment.  Fasciotomy was performed to the deep posterior compartment.  There was no gross contamination. 3.  Debridement of muscle and subcutaneous tissue performed on medial and lateral wounds with primary closure and placement of incisional wound VAC.  Procedure: The patient was identified in the preoperative holding area. Consent was  confirmed with the patient and their family and all questions were answered. The operative extremity was marked after confirmation with the patient. she was then brought back to the operating room by our anesthesia colleagues.  She was placed under general anesthetic and carefully transferred over to a radiolucent flat top table.  The right lower extremity was prepped and draped in usual sterile fashion.  A timeout was performed to verify the patient, the procedure, and the extremity.  Preoperative antibiotics were dosed.  There is a 2 cm laceration along the medial aspect of the calf.  I extended this proximally and distally for a total of 9 to 10 cm to be able to fully access the wound.  Once I carefully dissected down that the wound probe deep to the deep posterior compartment.  I extended the rent in the fascia to provide a full fasciotomy release of the deep posterior compartment.  I did not appreciate any gross contamination.  I did perform excisional debridement of some devitalized fascia and muscle.  However the majority of the muscle was intact without significant damage.  I then used low pressure pulsatile lavage to thoroughly irrigate the wound.  A total of 3 L was used.  There was a small 5 mm puncture wound on the posterior lateral aspect of the calf that I proceeded to extend about 2 cm in length and was able to probe down and irrigate and perform excisional debridement of subcutaneous tissue.  After both wounds were irrigated I placed gram of vancomycin powder in the wounds.  I then performed a layer closure of 2-0 Vicryl and 3-0 nylon.  An incisional wound VAC was placed on the medial aspect of  the wound.  A sterile dressing consisting of cast padding and Ace wrap was placed.  The patient was awoken from anesthesia and taken the PACU in stable condition.  Post Op Plan/Instructions: The patient will be weightbearing as tolerated to the right lower extremity.  She will receive full dose aspirin  for DVT prophylaxis.  I do not feel that IV antibiotics is warranted.  She may discharge home today with the incisional wound VAC in place with a close follow-up next week for a wound check.  I was present and performed the entire surgery.  Patrecia Pace, PA-C did assist me throughout the case. An assistant was necessary given the difficulty in approach, and ability to maintain exposure for thorough irrigation and debridement.Katha Hamming, MD [facility]

## 2020-02-17 NOTE — Evaluation (Addendum)
Physical Therapy Evaluation Patient Details Name: Renee Taylor MRN: [id number] DOB: August 31, 1996 Today's Date: 02/17/2020   History of Present Illness  Pt is a 24 y.o. F with no significant PMH who presents with right lower extremity crush injury s/p decompression of deep posterior compartment of right leg and debridement.  Clinical Impression  Pt admitted s/p procedure listed above. Pt presents with post surgical RLE pain, weakness, and decreased ROM. Session focused on gait training with use of crutches for pain management/control. Pt ambulating room distances utilizing 3 point gait pattern at a min guard assist level. Has good support at home and only one small step to enter home. Provided pt with written exercise handout to initiate open chain strengthening exercises for RLE. See below for recommendations.     Follow Up Recommendations Outpatient PT (when appropriate per surgeon)    Equipment Recommendations  Crutches;3in1 (PT);Wheelchair (measurements PT);Wheelchair cushion (measurements PT)    Recommendations for Other Services       Precautions / Restrictions Restrictions Weight Bearing Restrictions: Yes RLE Weight Bearing: Weight bearing as tolerated      Mobility  Bed Mobility Overal bed mobility: Needs Assistance Bed Mobility: Supine to Sit;Sit to Supine     Supine to sit: Min assist Sit to supine: Supervision   General bed mobility comments: MinA for RLE negotiation out of bed  Transfers Overall transfer level: Needs assistance Equipment used: Crutches Transfers: Sit to/from Stand Sit to Stand: Supervision         General transfer comment: Cues for crutch management  Ambulation/Gait Ambulation/Gait assistance: Min guard Gait Distance (Feet): 25 Feet Assistive device: Crutches Gait Pattern/deviations: Step-to pattern;Decreased stance time - right;Decreased weight shift to right;Antalgic Gait velocity: decreased   General Gait Details: Pt with 3 point gait  pattern. Cues for sequencing, crutch management. Pt with minimal weightbearing through RLE  Stairs            Wheelchair Mobility    Modified Rankin (Stroke Patients Only)       Balance Overall balance assessment: Mild deficits observed, not formally tested                                           Pertinent Vitals/Pain Pain Assessment: Faces Faces Pain Scale: Hurts whole lot Pain Location: RLE Pain Descriptors / Indicators: Crying;Operative site guarding Pain Intervention(s): Limited activity within patient's tolerance;Monitored during session;Premedicated before session;Repositioned    Home Living Family/patient expects to be discharged to:: Private residence Living Arrangements: Spouse/significant other Available Help at Discharge: Family Type of Home: House Home Access: Stairs to enter   Secretary/administrator of Steps: 1 Home Layout: One level Home Equipment: None      Prior Function Level of Independence: Independent         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        Extremity/Trunk Assessment   Upper Extremity Assessment Upper Extremity Assessment: Overall WFL for tasks assessed    Lower Extremity Assessment Lower Extremity Assessment: RLE deficits/detail RLE Deficits / Details: Limited SLR, heel slide       Communication   Communication: No difficulties  Cognition Arousal/Alertness: Awake/alert Behavior During Therapy: WFL for tasks assessed/performed Overall Cognitive Status: Within Functional Limits for tasks assessed  General Comments      Exercises     Assessment/Plan    PT Assessment Patient needs continued PT services  PT Problem List Decreased strength;Decreased range of motion;Decreased activity tolerance;Decreased balance;Decreased mobility;Pain       PT Treatment Interventions DME instruction;Gait training;Stair  training;Functional mobility training;Therapeutic activities;Therapeutic exercise;Balance training;Patient/family education    PT Goals (Current goals can be found in the Care Plan section)  Acute Rehab PT Goals Patient Stated Goal: less pain PT Goal Formulation: With patient/family Time For Goal Achievement: 03/02/20 Potential to Achieve Goals: Good    Frequency Min 5X/week   Barriers to discharge        Co-evaluation               AM-PAC PT "6 Clicks" Mobility  Outcome Measure Help needed turning from your back to your side while in a flat bed without using bedrails?: None Help needed moving from lying on your back to sitting on the side of a flat bed without using bedrails?: A Little Help needed moving to and from a bed to a chair (including a wheelchair)?: A Little Help needed standing up from a chair using your arms (e.g., wheelchair or bedside chair)?: None Help needed to walk in hospital room?: A Little Help needed climbing 3-5 steps with a railing? : A Little 6 Click Score: 20    End of Session Equipment Utilized During Treatment: Gait belt Activity Tolerance: Patient tolerated treatment well Patient left: in bed;with call bell/phone within reach;with family/visitor present Nurse Communication: Mobility status PT Visit Diagnosis: Pain;Difficulty in walking, not elsewhere classified (R26.2) Pain - Right/Left: Right Pain - part of body: Leg    Time: 1409-1430 PT Time Calculation (min) (ACUTE ONLY): 21 min   Charges:   PT Evaluation $PT Eval Low Complexity: Braden, PT, DPT Acute Rehabilitation Services Pager [phone] Office [phone]   Deno Etienne 02/17/2020, 3:01 PM

## 2020-02-17 NOTE — Progress Notes (Signed)
Report has been given to OR & patient is off the floor for surgery.

## 2020-02-17 NOTE — Progress Notes (Signed)
Orthopedic Tech Progress Note Patient Details:  Renee Taylor [birth year] [id number]  Ortho Devices Type of Ortho Device: Crutches Ortho Device/Splint Interventions: Ordered   Post Interventions Patient Tolerated: Well Instructions Provided: Care of device   Waqas Bruhl E Quentyn Kolbeck 02/17/2020, 2:14 PM

## 2020-02-17 NOTE — Progress Notes (Signed)
Ortho Trauma Progress Note  Reviewed case with Dr. Carola Frost and have taken over her care.  24 year old female who had industrial crushing injury to her leg at work.  She was admitted for IV antibiotics and monitoring of her CK.  Due to the significance of her wound we will proceed with irrigation debridement with possible closure versus possible wound VAC placement.  I evaluated the patient in the preoperative holding area.  I discussed risks and benefits with the patient.  She agrees to proceed with surgery and consent was obtained.  If we are able to perform thorough debridement and be able to perform primary closure we will discharge patient later today.  Roby Lofts, MD [facility] [phone] (office) [email or web address]

## 2020-02-17 NOTE — Progress Notes (Signed)
Patient suffers from right lower extremity crush injury s/p decompression of deep posterior compartment of right leg and debridement  which impairs their ability to perform daily activities like ambulate and perform ADL's in the home.  A walker alone will not resolve the issues with performing activities of daily living. A wheelchair will allow patient to safely perform daily activities.  The patient can self propel in the home or has a caregiver who can provide assistance.       Lillia Pauls, PT, DPT Acute Rehabilitation Services Pager [phone] Office [phone]

## 2020-02-17 NOTE — TOC Transition Note (Addendum)
Transition of Care [facility]) - CM/SW Discharge Note   Patient Details  Name: Renee Taylor MRN: [id number] Date of Birth: 1996-10-03  Transition of Care [facility]) CM/SW Contact:  Epifanio Lesches, RN Phone Number: [phone] 02/17/2020, 2:58 PM   Clinical Narrative:    Presents with Right lower leg puncture wound/crush injury, s/p Decompression of deep posterior compartment of right leg, Debridement of right lower leg, Incisional wound vac placement/Prevena, 02/17/2020.  Pt states injured leg while working. States case will be a workmens compensation case. Husband states they need to speak with attorney 1st before contacting wife's work HR. NCM explained to pt/husband regarding DME ( wheelchair and 3 in 1/BSC) it will an out of pocket / private pay cost 2/2 not providing insurance. Husband agreeable to cost for DME. Orders noted for W/C and 3 in 1/BSC. Referral made with [employer] ... equipment will be delivered to bedside prior to d/c.  Pt will transition to home today. Husband to provide  Transportation to home. Varney Daily (Spouse)  Showing 1 of 1   [phone]         Final next level of care: Home/Self Care Barriers to Discharge: No Barriers Identified   Patient Goals and CMS Choice     Choice offered to / list presented to : Patient  Discharge Placement     Discharge Plan and Services                DME Arranged: Wheelchair manual, 3 in 1 / [facility] DME Agency: [employer] Date DME Agency Contacted: 02/17/20 Time DME Agency Contacted: [phone] Representative spoke with at DME Agency: [facility]            Social Determinants of Health (SDOH) Interventions     Readmission Risk Interventions No flowsheet data found.

## 2020-02-17 NOTE — Discharge Summary (Addendum)
Orthopaedic Trauma Service (OTS) Discharge Summary   Patient ID: Renee Taylor MRN: [id number] DOB/AGE: 12-06-1995 24 y.o.  Admit date: 02/16/2020 Discharge date: 02/17/2020  Admission Diagnoses: Right lower leg puncture wound/crush injury   Discharge Diagnoses:  Active Problems:   Crushing injury of right lower leg   History reviewed. No pertinent past medical history.   Procedures Performed: 1. CPT 27601-Decompression of deep posterior compartment of right leg 2. CPT 11043-Debridement of right lower leg <20 sq cm 3. CPT 97605-Incisional wound vac placement  Discharged Condition: stable  Hospital Course: Patient presented to [facility] emergency department on 02/16/2020 after sustaining a crushing injury to her right calf.  Was noted to have 2 wounds to the right calf with some loss of muscle contour on the medial side.  Orthopedic trauma service consulted for evaluation.  Wounds were dressed with fresh saline dressing and patient was admitted for observation, CK checks, aggressive hydration, plans of surgical debridement.  Patient given IV Rocephin for open wound prophylaxis.  Patient taken to the operating room by Dr. Jena Gauss on 02/17/2020 for the above procedures.  She tolerated this well without complications.  Was instructed to be weightbearing as tolerated on right lower extremity postoperatively.  It was not felt that postoperative antibiotics for necessary. Was evaluated and treated by physical therapy post-operatively On the afternoon of 02/17/2020, the patient was tolerating diet, pain well controlled, tolerating therapies, vital signs stable, dressings clean, dry, intact and felt stable for discharge to home. Patient will follow up as below and knows to call with questions or concerns.     Consults: None  Significant Diagnostic Studies:   Results for orders placed or performed during the hospital encounter of 02/16/20 (from the past 168 hour(s))  CBC with  Differential/Platelet   Collection Time: 02/16/20  5:00 PM  Result Value Ref Range   WBC 17.1 (H) 4.0 - 10.5 K/uL   RBC 4.43 3.87 - 5.11 MIL/uL   Hemoglobin 13.6 12.0 - 15.0 g/dL   HCT 03.5 00.9 - 38.1 %   MCV 91.4 80.0 - 100.0 fL   MCH 30.7 26.0 - 34.0 pg   MCHC 33.6 30.0 - 36.0 g/dL   RDW 82.9 93.7 - 16.9 %   Platelets 278 150 - 400 K/uL   nRBC 0.0 0.0 - 0.2 %   Neutrophils Relative % 78 %   Neutro Abs 13.3 (H) 1.7 - 7.7 K/uL   Lymphocytes Relative 14 %   Lymphs Abs 2.4 0.7 - 4.0 K/uL   Monocytes Relative 7 %   Monocytes Absolute 1.2 (H) 0.1 - 1.0 K/uL   Eosinophils Relative 0 %   Eosinophils Absolute 0.1 0.0 - 0.5 K/uL   Basophils Relative 0 %   Basophils Absolute 0.1 0.0 - 0.1 K/uL   Immature Granulocytes 1 %   Abs Immature Granulocytes 0.08 (H) 0.00 - 0.07 K/uL  Basic metabolic panel   Collection Time: 02/16/20  5:00 PM  Result Value Ref Range   Sodium 137 135 - 145 mmol/L   Potassium 3.4 (L) 3.5 - 5.1 mmol/L   Chloride 105 98 - 111 mmol/L   CO2 23 22 - 32 mmol/L   Glucose, Bld 141 (H) 70 - 99 mg/dL   BUN 12 6 - 20 mg/dL   Creatinine, Ser 6.78 0.44 - 1.00 mg/dL   Calcium 9.0 8.9 - 93.8 mg/dL   GFR calc non Af Amer >60 >60 mL/min   GFR calc Af Amer >60 >60 mL/min   Anion  gap 9 5 - 15  HIV Antibody (routine testing w rflx)   Collection Time: 02/16/20  8:28 PM  Result Value Ref Range   HIV Screen 4th Generation wRfx NON REACTIVE NON REACTIVE  CK   Collection Time: 02/16/20  8:28 PM  Result Value Ref Range   Total CK 2,228 (H) 38 - 234 U/L  VITAMIN D 25 Hydroxy (Vit-D Deficiency, Fractures)   Collection Time: 02/16/20  8:28 PM  Result Value Ref Range   Vit D, 25-Hydroxy 29.12 (L) 30 - 100 ng/mL  Respiratory Panel by RT PCR (Flu A&B, Covid) - Nasopharyngeal Swab   Collection Time: 02/16/20  9:25 PM   Specimen: Nasopharyngeal Swab  Result Value Ref Range   SARS Coronavirus 2 by RT PCR NEGATIVE NEGATIVE   Influenza A by PCR NEGATIVE NEGATIVE   Influenza B by PCR  NEGATIVE NEGATIVE  POC urine preg, ED (not at [facility])   Collection Time: 02/16/20 10:12 PM  Result Value Ref Range   Preg Test, Ur NEGATIVE NEGATIVE  Surgical pcr screen   Collection Time: 02/17/20  1:16 AM   Specimen: Nasal Mucosa; Nasal Swab  Result Value Ref Range   MRSA, PCR POSITIVE (A) NEGATIVE   Staphylococcus aureus POSITIVE (A) NEGATIVE  Comprehensive metabolic panel   Collection Time: 02/17/20  2:30 AM  Result Value Ref Range   Sodium 141 135 - 145 mmol/L   Potassium 3.9 3.5 - 5.1 mmol/L   Chloride 105 98 - 111 mmol/L   CO2 24 22 - 32 mmol/L   Glucose, Bld 112 (H) 70 - 99 mg/dL   BUN 8 6 - 20 mg/dL   Creatinine, Ser 9.51 0.44 - 1.00 mg/dL   Calcium 9.0 8.9 - 88.4 mg/dL   Total Protein 6.5 6.5 - 8.1 g/dL   Albumin 3.7 3.5 - 5.0 g/dL   AST 68 (H) 15 - 41 U/L   ALT 23 0 - 44 U/L   Alkaline Phosphatase 48 38 - 126 U/L   Total Bilirubin 0.7 0.3 - 1.2 mg/dL   GFR calc non Af Amer >60 >60 mL/min   GFR calc Af Amer >60 >60 mL/min   Anion gap 12 5 - 15  CBC   Collection Time: 02/17/20  2:30 AM  Result Value Ref Range   WBC 10.4 4.0 - 10.5 K/uL   RBC 4.19 3.87 - 5.11 MIL/uL   Hemoglobin 12.8 12.0 - 15.0 g/dL   HCT 16.6 06.3 - 01.6 %   MCV 92.1 80.0 - 100.0 fL   MCH 30.5 26.0 - 34.0 pg   MCHC 33.2 30.0 - 36.0 g/dL   RDW 01.0 93.2 - 35.5 %   Platelets 267 150 - 400 K/uL   nRBC 0.0 0.0 - 0.2 %  CK   Collection Time: 02/17/20  2:30 AM  Result Value Ref Range   Total CK 3,186 (H) 38 - 234 U/L     Treatments: IV hydration, antibiotics: ceftriaxone, analgesia: acetaminophen, Vicodin and Morphine and surgery: Debridement right lower extremity wounds  Discharge Exam: General: Laying in bed, no acute distress Respiratory: No increased work of breathing at rest Right lower extremity: Surgical dressing in place, is clean, dry, intact.  Nontender in the thigh or knee.  Compartments remain soft and compressible.  Ankle dorsiflexion/plantarflexion intact but slightly limited due  to pain.  Sensation intact to light touch distally.  Neurovascularly intact  Disposition: Discharge disposition: 01-Home or Self Care       Discharge Instructions  Call MD / Call 911   Complete by: As directed    If you experience chest pain or shortness of breath, CALL 911 and be transported to the hospital emergency room.  If you develope a fever above 101 F, pus (white drainage) or increased drainage or redness at the wound, or calf pain, call your surgeon's office.   Constipation Prevention   Complete by: As directed    Drink plenty of fluids.  Prune juice may be helpful.  You may use a stool softener, such as Colace (over the counter) 100 mg twice a day.  Use MiraLax (over the counter) for constipation as needed.   Diet - low sodium heart healthy   Complete by: As directed    Increase activity slowly as tolerated   Complete by: As directed      Allergies as of 02/17/2020      Reactions   Meloxicam Shortness Of Breath   Sulfa Antibiotics Nausea And Vomiting   Macrobid [nitrofurantoin] Rash      Medication List    TAKE these medications   aspirin EC 325 MG tablet Take 1 tablet (325 mg total) by mouth daily.   HYDROcodone-acetaminophen 5-325 MG tablet Commonly known as: NORCO/VICODIN Take 1-2 tablets by mouth every 6 (six) hours as needed for severe pain.      Follow-up Information    Haddix, Thomasene Lot, MD. Schedule an appointment as soon as possible for a visit in 1 week.   Specialty: Orthopedic Surgery Why: For wound re-check Contact information: [place] [address] [phone]           Discharge Instructions and Plan: Patient will be discharged to home.  Will be weightbearing as tolerated on the right lower extremity.  Will be discharged on Aspirin for DVT prophylaxis. Was provided with necessary DME for discharge. Patient will follow up with Dr. Doreatha Martin in 1 weeks for repeat wound check.   Signed:  Leary Roca. Carmie Kanner ?([phone]? (phone) 02/17/2020, 1:53 PM  [facility] [place] [address] [phone] (F)

## 2020-02-17 NOTE — Discharge Instructions (Signed)
Orthopaedic Trauma Service Discharge Instructions   General Discharge Instructions  WEIGHT BEARING STATUS: Weightbearing as tolerated right lower extremity  RANGE OF MOTION/ACTIVITY: Okay for unrestricted motion of the right leg.  Wound Care: Keep purple incisional vac in place until follow up next week. Okay to remove ace wrap dressing on post op day #2 (02/19/20). Okay to shower, would recommend sponge bath for right leg.   DVT/PE prophylaxis: Aspirin 325 mg daily x 30 days  Diet: as you were eating previously.  Can use over the counter stool softeners and bowel preparations, such as Miralax, to help with bowel movements.  Narcotics can be constipating.  Be sure to drink plenty of fluids  PAIN MEDICATION USE AND EXPECTATIONS  You have likely been given narcotic medications to help control your pain.  After a traumatic event that results in an fracture (broken bone) with or without surgery, it is ok to use narcotic pain medications to help control one's pain.  We understand that everyone responds to pain differently and each individual patient will be evaluated on a regular basis for the continued need for narcotic medications. Ideally, narcotic medication use should last no more than 6-8 weeks (coinciding with fracture healing).   As a patient it is your responsibility as well to monitor narcotic medication use and report the amount and frequency you use these medications when you come to your office visit.   We would also advise that if you are using narcotic medications, you should take a dose prior to therapy to maximize you participation.  IF YOU ARE ON NARCOTIC MEDICATIONS IT IS NOT PERMISSIBLE TO OPERATE A MOTOR VEHICLE (MOTORCYCLE/CAR/TRUCK/MOPED) OR HEAVY MACHINERY DO NOT MIX NARCOTICS WITH OTHER CNS (CENTRAL NERVOUS SYSTEM) DEPRESSANTS SUCH AS ALCOHOL   STOP SMOKING OR USING NICOTINE PRODUCTS!!!!  As discussed nicotine severely impairs your body's ability to heal surgical and  traumatic wounds but also impairs bone healing.  Wounds and bone heal by forming microscopic blood vessels (angiogenesis) and nicotine is a vasoconstrictor (essentially, shrinks blood vessels).  Therefore, if vasoconstriction occurs to these microscopic blood vessels they essentially disappear and are unable to deliver necessary nutrients to the healing tissue.  This is one modifiable factor that you can do to dramatically increase your chances of healing your injury.    (This means no smoking, no nicotine gum, patches, etc)  DO NOT USE NONSTEROIDAL ANTI-INFLAMMATORY DRUGS (NSAID'S)  Using products such as Advil (ibuprofen), Aleve (naproxen), Motrin (ibuprofen) for additional pain control during fracture healing can delay and/or prevent the healing response.  If you would like to take over the counter (OTC) medication, Tylenol (acetaminophen) is ok.  However, some narcotic medications that are given for pain control contain acetaminophen as well. Therefore, you should not exceed more than 4000 mg of tylenol in a day if you do not have liver disease.  Also note that there are may OTC medicines, such as cold medicines and allergy medicines that my contain tylenol as well.  If you have any questions about medications and/or interactions please ask your doctor/PA or your pharmacist.      ICE AND ELEVATE INJURED/OPERATIVE EXTREMITY  Using ice and elevating the injured extremity above your heart can help with swelling and pain control.  Icing in a pulsatile fashion, such as 20 minutes on and 20 minutes off, can be followed.    Do not place ice directly on skin. Make sure there is a barrier between to skin and the ice pack.  Using frozen items such as frozen peas works well as the conform nicely to the are that needs to be iced.  USE AN ACE WRAP OR TED HOSE FOR SWELLING CONTROL  In addition to icing and elevation, Ace wraps or TED hose are used to help limit and resolve swelling.  It is recommended to use  Ace wraps or TED hose until you are informed to stop.    When using Ace Wraps start the wrapping distally (farthest away from the body) and wrap proximally (closer to the body)   Example: If you had surgery on your leg or thing and you do not have a splint on, start the ace wrap at the toes and work your way up to the thigh        If you had surgery on your upper extremity and do not have a splint on, start the ace wrap at your fingers and work your way up to the upper arm    [place]: [phone]   VISIT OUR WEBSITE FOR ADDITIONAL INFORMATION: [email or web address]      Discharge Wound Care Instructions  Do NOT apply any ointments, solutions or lotions to pin sites or surgical wounds.  These prevent needed drainage and even though solutions like hydrogen peroxide kill bacteria, they also damage cells lining the pin sites that help fight infection.  Applying lotions or ointments can keep the wounds moist and can cause them to breakdown and open up as well. This can increase the risk for infection. When in doubt call the office.  Surgical incisions should be dressed daily.  If any drainage is noted, use one layer of adaptic, then gauze, Kerlix, and an ace wrap.  Once the incision is completely dry and without drainage, it may be left open to air out.  Showering may begin 36-48 hours later.  Cleaning gently with soap and water.  Traumatic wounds should be dressed daily as well.    One layer of adaptic, gauze, Kerlix, then ace wrap.  The adaptic can be discontinued once the draining has ceased    If you have a wet to dry dressing: wet the gauze with saline the squeeze as much saline out so the gauze is moist (not soaking wet), place moistened gauze over wound, then place a dry gauze over the moist one, followed by Kerlix wrap, then ace wrap.

## 2020-02-17 NOTE — Anesthesia Postprocedure Evaluation (Signed)
Anesthesia Post Note  Patient: Renee Taylor  Procedure(s) Performed: IRRIGATION AND DEBRIDEMENT EXTREMITY Application of Wound Vac (Right Leg Lower)     Patient location during evaluation: PACU Anesthesia Type: General Level of consciousness: awake and alert Pain management: pain level controlled Vital Signs Assessment: post-procedure vital signs reviewed and stable Respiratory status: spontaneous breathing, nonlabored ventilation, respiratory function stable and patient connected to nasal cannula oxygen Cardiovascular status: blood pressure returned to baseline and stable Postop Assessment: no apparent nausea or vomiting Anesthetic complications: no    Last Vitals:  Vitals:   02/17/20 1039 02/17/20 1352  BP: 113/81 107/62  Pulse: 95 (!) 105  Resp: 17 18  Temp: (!) 36.3 C 36.8 C  SpO2: 100% 100%    Last Pain:  Vitals:   02/17/20 1705  TempSrc:   PainSc: 2                  Mercedees Convery P Shaely Gadberry

## 2020-02-18 NOTE — ED Provider Notes (Signed)
[facility] Provider Note   CSN: [id number] Arrival date & time: 02/16/20  1601     History No chief complaint on file.   Renee Taylor is a 24 y.o. female.  HPI Patient presents to the emergency department with a crush injury to her right lower leg.  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  The patient states that she was in the machine trying to readjust the calibrations.  When the machine normally will go in slow mode while they are doing this sped up and caught her right lower leg in the machine.  The patient was brought in by EMS with a splint and dressing around the leg.  She has a puncture wound to the medial aspect of her proximal anterior lower leg.  Patient has no other injuries and does not complain of any other issues.  Patient does not have any numbness or weakness in the foot.  History reviewed. No pertinent past medical history.  Patient Active Problem List   Diagnosis Date Noted  . Crushing injury of right lower leg 02/16/2020    Past Surgical History:  Procedure Laterality Date  . WISDOM TOOTH EXTRACTION       OB History   No obstetric history on file.     No family history on file.  Social History   Tobacco Use  . Smoking status: Never Smoker  . Smokeless tobacco: Never Used  Substance Use Topics  . Alcohol use: Yes    Comment: occasionally  . Drug use: Never    Home Medications Prior to Admission medications   Medication Sig Start Date End Date Taking? Authorizing Provider  aspirin EC 325 MG tablet Take 1 tablet (325 mg total) by mouth daily. 02/17/20 03/18/20  Despina Hidden, PA-C  HYDROcodone-acetaminophen (NORCO/VICODIN) 5-325 MG tablet Take 1-2 tablets by mouth every 6 (six) hours as needed for severe pain. 02/17/20   Despina Hidden, PA-C    Allergies    Meloxicam, Sulfa antibiotics, and Macrobid [nitrofurantoin]  Review of Systems    Review of Systems All other systems negative except as documented in the HPI. All pertinent positives and negatives as reviewed in the HPI. Physical Exam Updated Vital Signs BP 107/62 (BP Location: Right Arm)   Pulse (!) 105   Temp 98.2 F (36.8 C) (Oral)   Resp 18   Ht 5\' 2"  (1.575 m)   Wt 58.1 kg   LMP 02/04/2020 (Exact Date)   SpO2 100%   BMI 23.41 kg/m   Physical Exam Vitals and nursing note reviewed.  Constitutional:      General: She is not in acute distress.    Appearance: She is well-developed.  HENT:     Head: Normocephalic and atraumatic.  Eyes:     Pupils: Pupils are equal, round, and reactive to light.  Pulmonary:     Effort: Pulmonary effort is normal.  Musculoskeletal:       Legs:  Skin:    General: Skin is warm and dry.  Neurological:     Mental Status: She is alert and oriented to person, place, and time.     ED Results / Procedures / Treatments   Labs (all labs ordered are listed, but only abnormal results are displayed) Labs Reviewed  SURGICAL PCR SCREEN - Abnormal; Notable for the following components:      Result Value   MRSA, PCR POSITIVE (*)  Staphylococcus aureus POSITIVE (*)    All other components within normal limits  CBC WITH DIFFERENTIAL/PLATELET - Abnormal; Notable for the following components:   WBC 17.1 (*)    Neutro Abs 13.3 (*)    Monocytes Absolute 1.2 (*)    Abs Immature Granulocytes 0.08 (*)    All other components within normal limits  BASIC METABOLIC PANEL - Abnormal; Notable for the following components:   Potassium 3.4 (*)    Glucose, Bld 141 (*)    All other components within normal limits  CK - Abnormal; Notable for the following components:   Total CK 2,228 (*)    All other components within normal limits  COMPREHENSIVE METABOLIC PANEL - Abnormal; Notable for the following components:   Glucose, Bld 112 (*)    AST 68 (*)    All other components within normal limits  VITAMIN D 25 HYDROXY (VIT D DEFICIENCY,  FRACTURES) - Abnormal; Notable for the following components:   Vit D, 25-Hydroxy 29.12 (*)    All other components within normal limits  CK - Abnormal; Notable for the following components:   Total CK 3,186 (*)    All other components within normal limits  RESPIRATORY PANEL BY RT PCR (FLU A&B, COVID)  HIV ANTIBODY (ROUTINE TESTING W REFLEX)  CBC  POC URINE PREG, ED    EKG None  Radiology DG Tibia/Fibula Right  Result Date: 02/16/2020 CLINICAL DATA:  Trauma, compression injury EXAM: RIGHT TIBIA AND FIBULA - 2 VIEW COMPARISON:  None. FINDINGS: No fracture of the tibia or fibula. There is soft tissue injury in the medial LEFT calf some subcutaneous gas and soft tissue depression.a. IMPRESSION: 1. Soft tissue injury. 2. No evidence of fracture. Electronically Signed   By: Suzy Bouchard M.D.   On: 02/16/2020 18:04   DG Knee Complete 4 Views Right  Result Date: 02/16/2020 CLINICAL DATA:  Status post trauma. EXAM: RIGHT KNEE - COMPLETE 4+ VIEW COMPARISON:  None. FINDINGS: No evidence of fracture, dislocation, or joint effusion. No evidence of arthropathy or other focal bone abnormality. A mild amount of soft tissue air is seen along the medial and lateral aspects of the right calf. IMPRESSION: Mild amount of soft tissue air within the medial and lateral aspects of the right calf, without evidence of associated acute osseous abnormality. Electronically Signed   By: Virgina Norfolk M.D.   On: 02/16/2020 18:03    Procedures Procedures (including critical care time)  Medications Ordered in ED Medications  Tdap (BOOSTRIX) injection 0.5 mL (0.5 mLs Intramuscular Given 02/16/20 1801)  HYDROmorphone (DILAUDID) injection 1 mg (1 mg Intravenous Given 02/16/20 1710)  ceFAZolin (ANCEF) IVPB 2g/100 mL premix (0 g Intravenous Stopped 02/16/20 1846)    ED Course  I have reviewed the triage vital signs and the nursing notes.  Pertinent labs & imaging results that were available during my care of the  patient were reviewed by me and considered in my medical decision making (see chart for details).    MDM Rules/Calculators/A&P                      I spoke with Dr. Marcelino Scot who is on for orthopedics about the patient.  He will evaluate the patient for further management and care of this injury.  On reassessment the patient still had normal pulses and sensation in her foot along with the fact that she had soft compartments still at this time.  I did advise the patient that her concern  is that she could develop compartment syndrome and that is why he will come and evaluate the patient. Final Clinical Impression(s) / ED Diagnoses Final diagnoses:  None    Rx / DC Orders ED Discharge Orders         Ordered    HYDROcodone-acetaminophen (NORCO/VICODIN) 5-325 MG tablet  Every 6 hours PRN     02/17/20 0935    Call MD / Call 911    Comments: If you experience chest pain or shortness of breath, CALL 911 and be transported to the hospital emergency room.  If you develope a fever above 101 F, pus (white drainage) or increased drainage or redness at the wound, or calf pain, call your surgeon's office.   02/17/20 0935    Diet - low sodium heart healthy     02/17/20 0935    Constipation Prevention    Comments: Drink plenty of fluids.  Prune juice may be helpful.  You may use a stool softener, such as Colace (over the counter) 100 mg twice a day.  Use MiraLax (over the counter) for constipation as needed.   02/17/20 0935    Increase activity slowly as tolerated     02/17/20 0935    aspirin EC 325 MG tablet  Daily     02/17/20 1352           Charlestine Night, Cordelia Poche 02/18/20 0007    Eber Hong, MD 02/18/20 2251

## 2020-03-02 ENCOUNTER — Other Ambulatory Visit (HOSPITAL_COMMUNITY): Payer: Self-pay | Admitting: Student

## 2020-03-02 ENCOUNTER — Other Ambulatory Visit: Payer: Self-pay

## 2020-03-02 ENCOUNTER — Ambulatory Visit (HOSPITAL_COMMUNITY)
Admission: RE | Admit: 2020-03-02 | Discharge: 2020-03-02 | Disposition: A | Discharge status: Home / Self Care | Source: Ambulatory Visit | Attending: Student | Admitting: Student

## 2020-03-02 DIAGNOSIS — M79604 Pain in right leg: Secondary | ICD-10-CM | POA: Insufficient documentation

## 2020-03-02 DIAGNOSIS — M7989 Other specified soft tissue disorders: Secondary | ICD-10-CM | POA: Insufficient documentation

## 2020-03-02 NOTE — Progress Notes (Signed)
Right lower extremity venous duplex ultrasound       has been completed. Preliminary results can be found under CV proc through chart review. Mackinley Cassaday, BS, RDMS, RVT   

## 2020-07-25 DIAGNOSIS — Z3A08 8 weeks gestation of pregnancy: Secondary | ICD-10-CM | POA: Diagnosis not present

## 2020-07-25 DIAGNOSIS — O3680X0 Pregnancy with inconclusive fetal viability, not applicable or unspecified: Secondary | ICD-10-CM | POA: Diagnosis not present

## 2020-07-25 DIAGNOSIS — Z3689 Encounter for other specified antenatal screening: Secondary | ICD-10-CM | POA: Diagnosis not present

## 2020-07-26 DIAGNOSIS — Z3689 Encounter for other specified antenatal screening: Secondary | ICD-10-CM | POA: Diagnosis not present

## 2020-07-26 DIAGNOSIS — Z3A08 8 weeks gestation of pregnancy: Secondary | ICD-10-CM | POA: Diagnosis not present

## 2020-08-22 DIAGNOSIS — Z3682 Encounter for antenatal screening for nuchal translucency: Secondary | ICD-10-CM | POA: Diagnosis not present

## 2020-08-22 DIAGNOSIS — Z3A13 13 weeks gestation of pregnancy: Secondary | ICD-10-CM | POA: Diagnosis not present

## 2020-09-19 DIAGNOSIS — Z3A16 16 weeks gestation of pregnancy: Secondary | ICD-10-CM | POA: Diagnosis not present

## 2020-10-17 DIAGNOSIS — Z363 Encounter for antenatal screening for malformations: Secondary | ICD-10-CM | POA: Diagnosis not present

## 2020-10-17 DIAGNOSIS — Z3A20 20 weeks gestation of pregnancy: Secondary | ICD-10-CM | POA: Diagnosis not present

## 2020-11-08 IMAGING — CR DG TIBIA/FIBULA 2V*R*
2 series · 2 of 2 positions shown · non-contrast
Comparison: None.

CLINICAL DATA: Trauma, compression injury

EXAM:
RIGHT TIBIA AND FIBULA - 2 VIEW

[tibia ap]
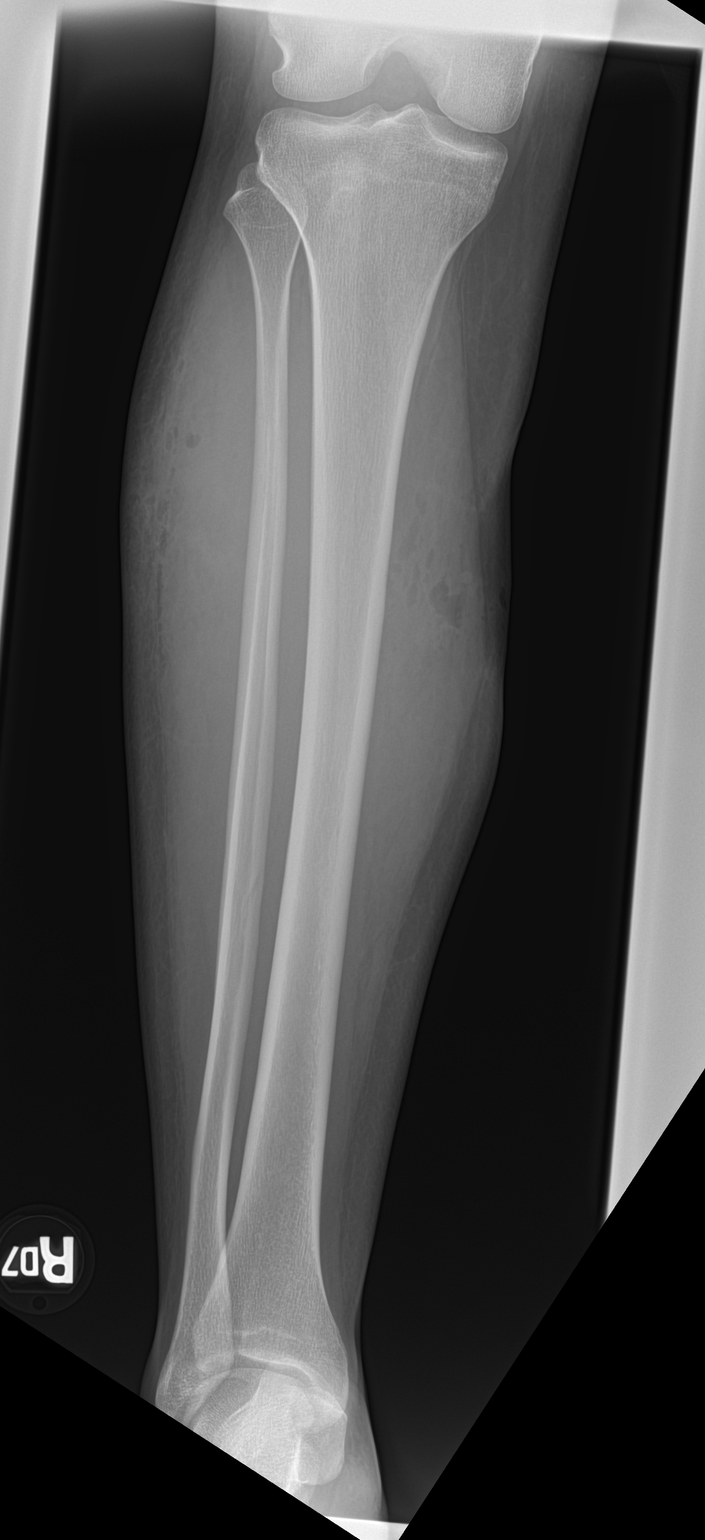

[tibia lat]
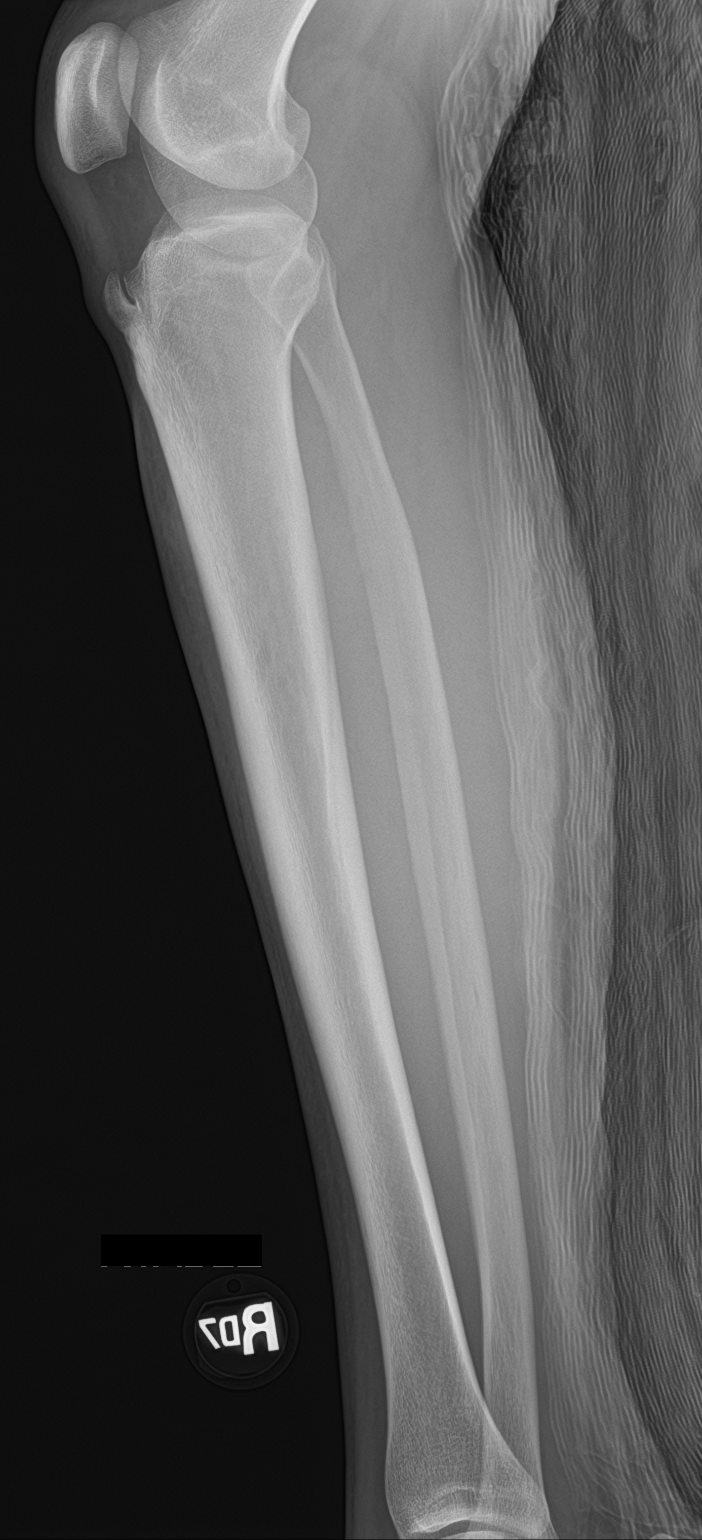

[2 of 2 positions shown; findings below may reference images not displayed]

FINDINGS: No fracture of the tibia or fibula. There is soft tissue injury in
the medial LEFT calf some subcutaneous gas and soft tissue
depression.a.
IMPRESSION: 1. Soft tissue injury.
2. No evidence of fracture.

## 2020-11-08 IMAGING — CR DG KNEE COMPLETE 4+V*R*
4 series · 4 of 4 positions shown · non-contrast
Comparison: None.

CLINICAL DATA: Status post trauma.

EXAM:
RIGHT KNEE - COMPLETE 4+ VIEW

[knee ap]
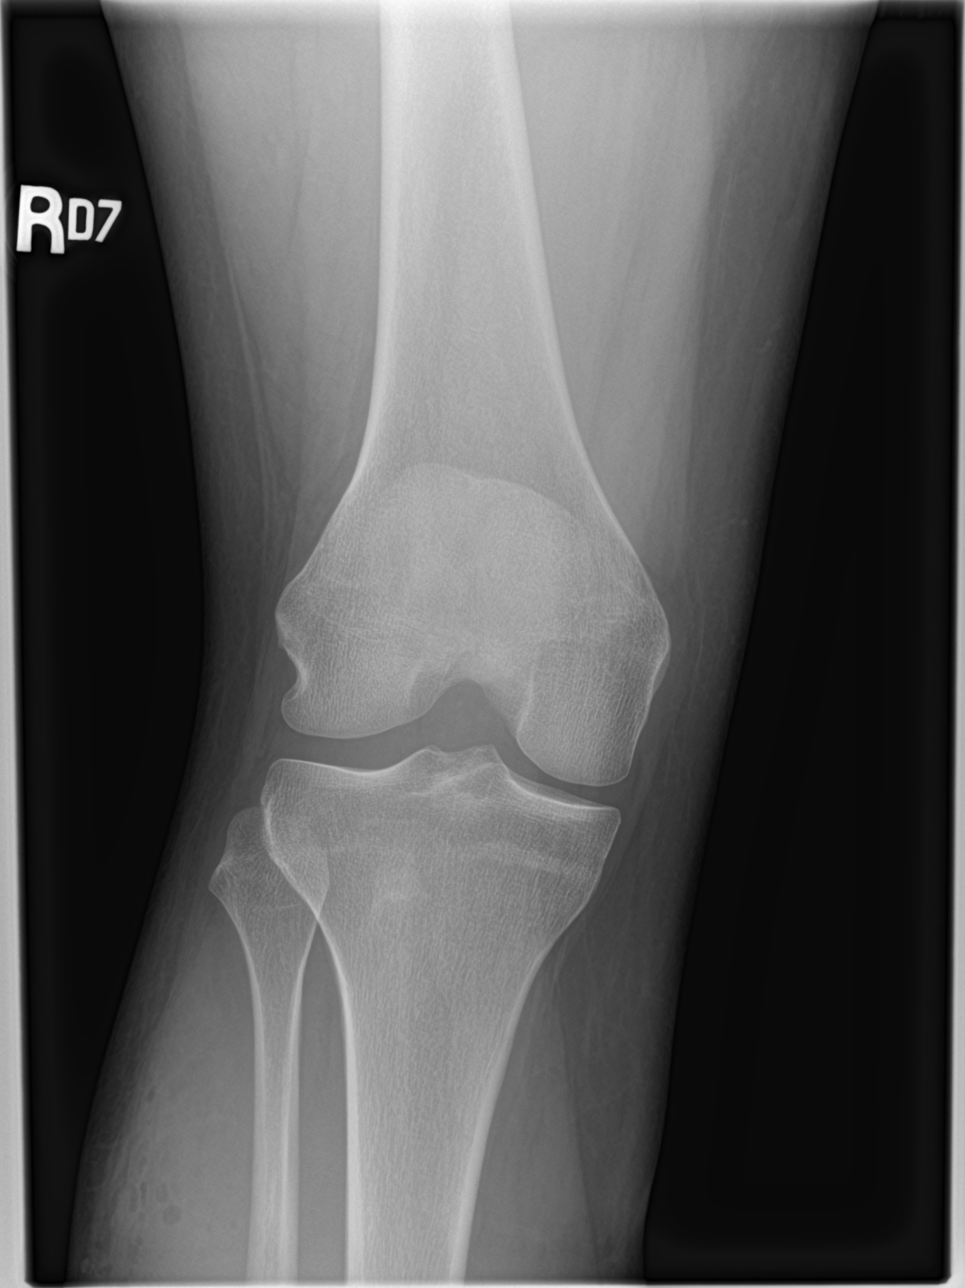

[knee lat]
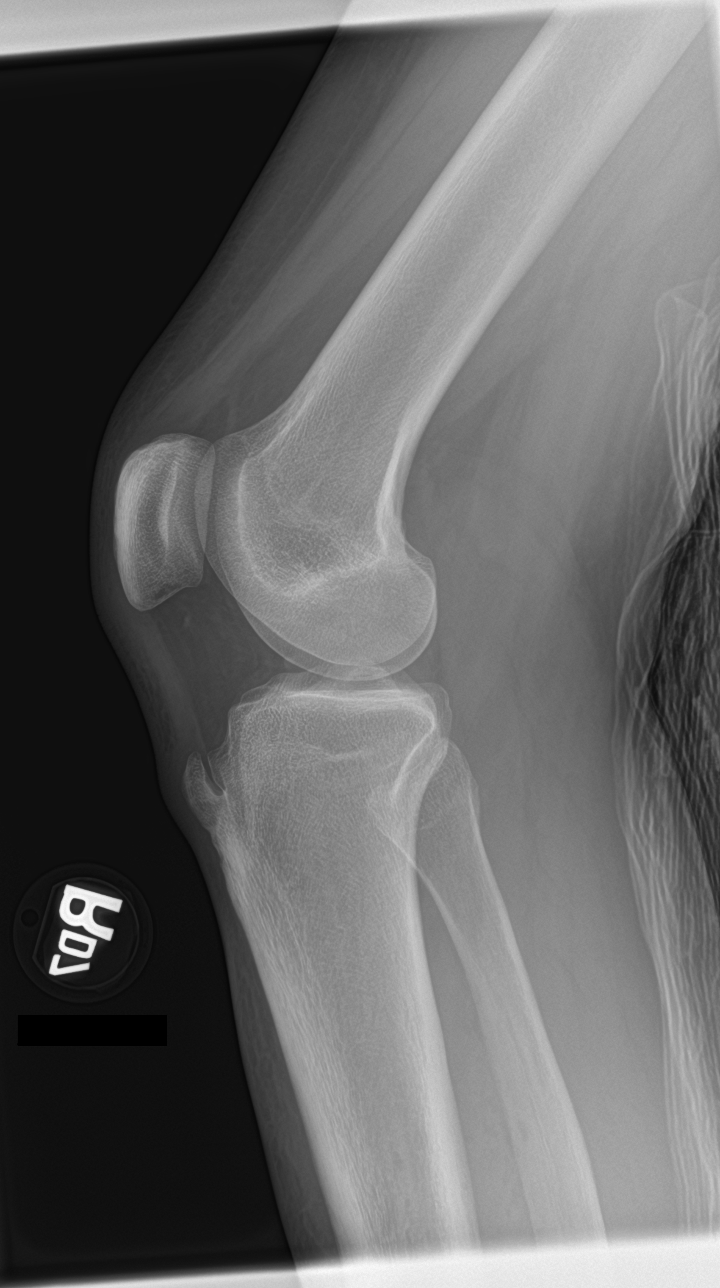

[knee obl (1 of 2)]
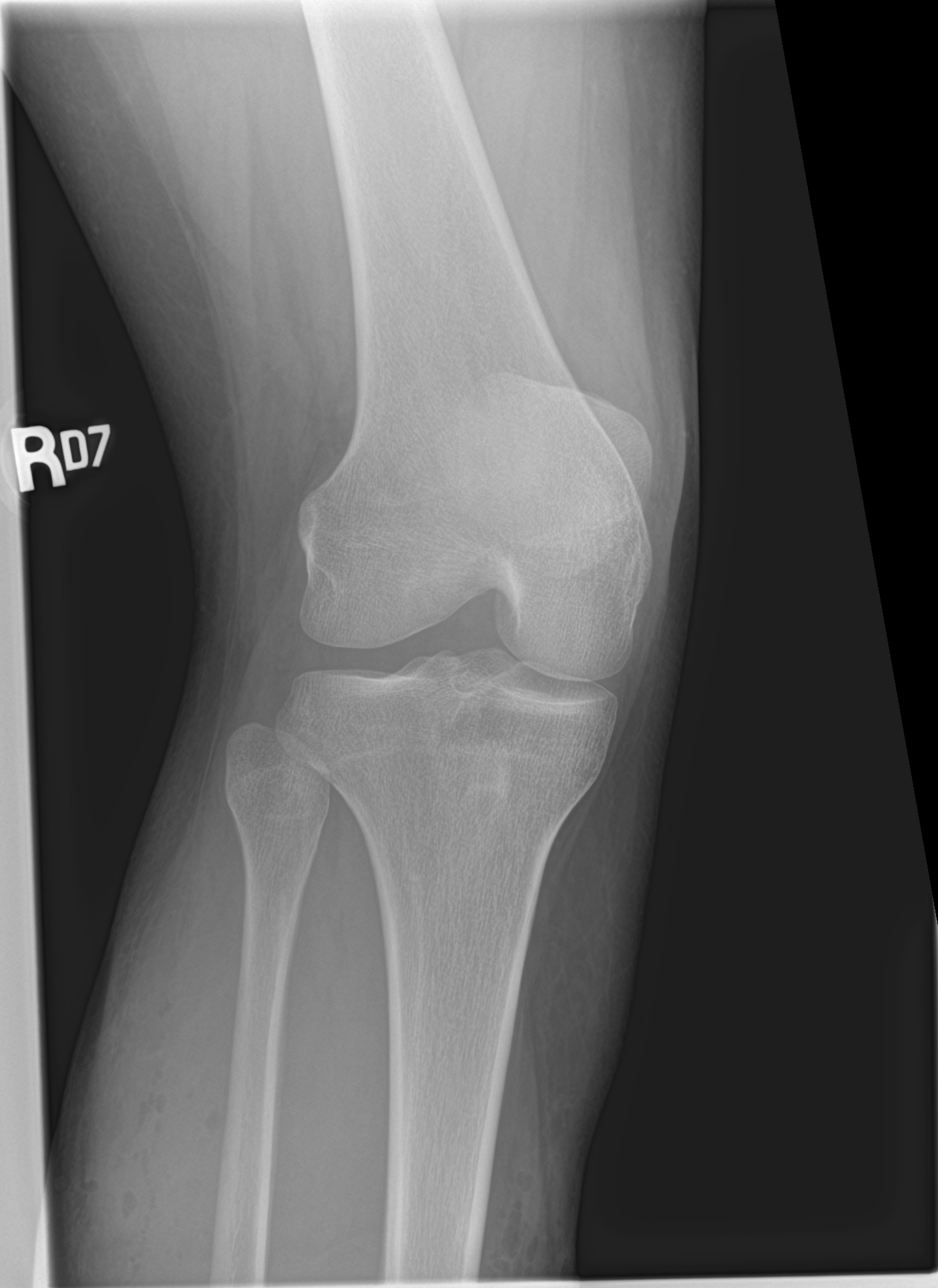

[knee obl (2 of 2)]
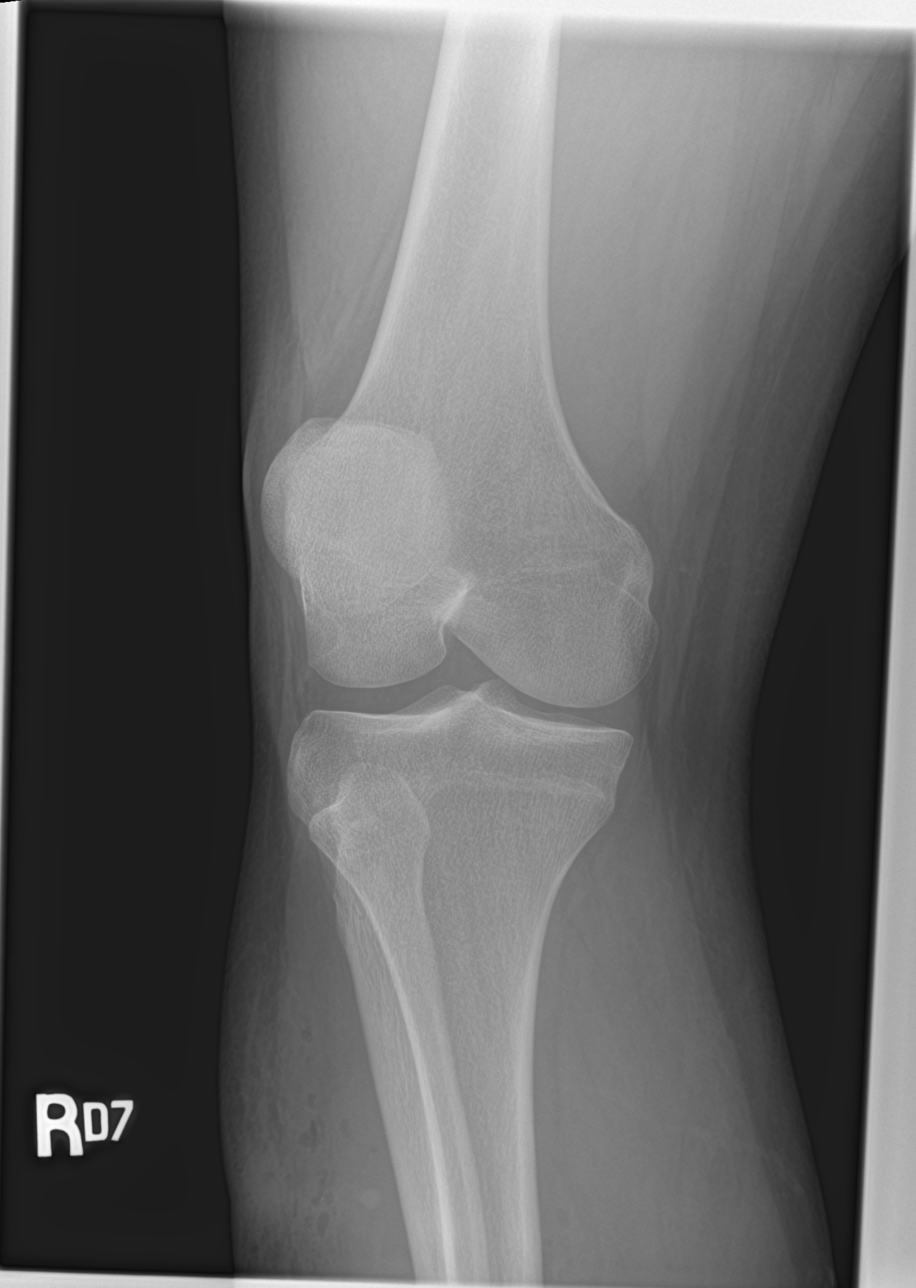

[4 of 4 positions shown; findings below may reference images not displayed]

FINDINGS: No evidence of fracture, dislocation, or joint effusion. No evidence
of arthropathy or other focal bone abnormality. A mild amount of
soft tissue air is seen along the medial and lateral aspects of the
right calf.
IMPRESSION: Mild amount of soft tissue air within the medial and lateral aspects
of the right calf, without evidence of associated acute osseous
abnormality.

## 2020-11-14 DIAGNOSIS — Z3A26 26 weeks gestation of pregnancy: Secondary | ICD-10-CM | POA: Diagnosis not present

## 2020-11-14 DIAGNOSIS — Z362 Encounter for other antenatal screening follow-up: Secondary | ICD-10-CM | POA: Diagnosis not present
